# Patient Record
Sex: Female | Born: 1940 | Race: White | Hispanic: No | Marital: Married | Smoking: Never smoker
Health system: Southern US, Community
[De-identification: ages and names within clinical notes are randomized; demographics above are authoritative.]

## PROBLEM LIST (undated history)

## (undated) DIAGNOSIS — E7800 Pure hypercholesterolemia, unspecified: Secondary | ICD-10-CM

## (undated) DIAGNOSIS — M810 Age-related osteoporosis without current pathological fracture: Secondary | ICD-10-CM

## (undated) DIAGNOSIS — I10 Essential (primary) hypertension: Secondary | ICD-10-CM

## (undated) DIAGNOSIS — Z800 Family history of malignant neoplasm of digestive organs: Secondary | ICD-10-CM

## (undated) DIAGNOSIS — D219 Benign neoplasm of connective and other soft tissue, unspecified: Secondary | ICD-10-CM

## (undated) DIAGNOSIS — C801 Malignant (primary) neoplasm, unspecified: Secondary | ICD-10-CM

## (undated) DIAGNOSIS — C50919 Malignant neoplasm of unspecified site of unspecified female breast: Secondary | ICD-10-CM

## (undated) HISTORY — DX: Malignant neoplasm of unspecified site of unspecified female breast: C50.919

## (undated) HISTORY — DX: Family history of malignant neoplasm of digestive organs: Z80.0

## (undated) HISTORY — PX: BASAL CELL CARCINOMA EXCISION: SHX1214

## (undated) HISTORY — DX: Benign neoplasm of connective and other soft tissue, unspecified: D21.9

## (undated) HISTORY — DX: Pure hypercholesterolemia, unspecified: E78.00

## (undated) HISTORY — DX: Essential (primary) hypertension: I10

## (undated) HISTORY — DX: Age-related osteoporosis without current pathological fracture: M81.0

## (undated) HISTORY — DX: Malignant (primary) neoplasm, unspecified: C80.1

---

## 1973-09-10 HISTORY — PX: THYROID SURGERY: SHX805

## 1973-09-10 HISTORY — PX: BREAST SURGERY: SHX581

## 1998-02-14 ENCOUNTER — Ambulatory Visit (HOSPITAL_BASED_OUTPATIENT_CLINIC_OR_DEPARTMENT_OTHER): Admission: RE | Admit: 1998-02-14 | Discharge: 1998-02-14 | Payer: Self-pay

## 1998-02-17 ENCOUNTER — Other Ambulatory Visit: Admission: RE | Admit: 1998-02-17 | Discharge: 1998-02-17 | Payer: Self-pay | Admitting: Obstetrics and Gynecology

## 1999-02-22 ENCOUNTER — Other Ambulatory Visit: Admission: RE | Admit: 1999-02-22 | Discharge: 1999-02-22 | Payer: Self-pay | Admitting: Obstetrics and Gynecology

## 2000-02-14 ENCOUNTER — Other Ambulatory Visit: Admission: RE | Admit: 2000-02-14 | Discharge: 2000-02-14 | Payer: Self-pay | Admitting: Obstetrics and Gynecology

## 2001-02-13 ENCOUNTER — Other Ambulatory Visit: Admission: RE | Admit: 2001-02-13 | Discharge: 2001-02-13 | Payer: Self-pay | Admitting: Obstetrics and Gynecology

## 2002-04-15 ENCOUNTER — Other Ambulatory Visit: Admission: RE | Admit: 2002-04-15 | Discharge: 2002-04-15 | Payer: Self-pay | Admitting: Obstetrics and Gynecology

## 2003-04-02 ENCOUNTER — Ambulatory Visit (HOSPITAL_COMMUNITY): Admission: RE | Admit: 2003-04-02 | Discharge: 2003-04-02 | Payer: Self-pay | Admitting: Gastroenterology

## 2003-04-29 ENCOUNTER — Other Ambulatory Visit: Admission: RE | Admit: 2003-04-29 | Discharge: 2003-04-29 | Payer: Self-pay | Admitting: Obstetrics and Gynecology

## 2004-05-18 ENCOUNTER — Other Ambulatory Visit: Admission: RE | Admit: 2004-05-18 | Discharge: 2004-05-18 | Payer: Self-pay | Admitting: Obstetrics and Gynecology

## 2005-06-28 ENCOUNTER — Other Ambulatory Visit: Admission: RE | Admit: 2005-06-28 | Discharge: 2005-06-28 | Payer: Self-pay | Admitting: Obstetrics and Gynecology

## 2006-08-08 ENCOUNTER — Other Ambulatory Visit: Admission: RE | Admit: 2006-08-08 | Discharge: 2006-08-08 | Payer: Self-pay | Admitting: Obstetrics and Gynecology

## 2008-08-26 ENCOUNTER — Ambulatory Visit: Payer: Self-pay | Admitting: Obstetrics and Gynecology

## 2008-08-26 ENCOUNTER — Encounter: Payer: Self-pay | Admitting: Obstetrics and Gynecology

## 2008-08-26 ENCOUNTER — Other Ambulatory Visit: Admission: RE | Admit: 2008-08-26 | Discharge: 2008-08-26 | Payer: Self-pay | Admitting: Obstetrics and Gynecology

## 2008-09-21 ENCOUNTER — Ambulatory Visit: Payer: Self-pay | Admitting: Obstetrics and Gynecology

## 2009-07-25 ENCOUNTER — Ambulatory Visit: Payer: Self-pay | Admitting: Obstetrics and Gynecology

## 2009-10-04 ENCOUNTER — Ambulatory Visit: Payer: Self-pay | Admitting: Obstetrics and Gynecology

## 2009-11-16 ENCOUNTER — Ambulatory Visit: Payer: Self-pay | Admitting: Obstetrics and Gynecology

## 2009-11-22 ENCOUNTER — Ambulatory Visit: Payer: Self-pay | Admitting: Obstetrics and Gynecology

## 2009-11-28 ENCOUNTER — Ambulatory Visit: Payer: Self-pay | Admitting: Gynecology

## 2009-12-06 ENCOUNTER — Ambulatory Visit: Payer: Self-pay | Admitting: Obstetrics and Gynecology

## 2009-12-20 ENCOUNTER — Ambulatory Visit: Payer: Self-pay | Admitting: Obstetrics and Gynecology

## 2009-12-31 ENCOUNTER — Inpatient Hospital Stay (HOSPITAL_COMMUNITY)
Admission: AD | Admit: 2009-12-31 | Discharge: 2009-12-31 | Payer: Self-pay | Source: Home / Self Care | Admitting: Obstetrics and Gynecology

## 2010-01-11 ENCOUNTER — Ambulatory Visit: Payer: Self-pay | Admitting: Gynecology

## 2010-01-13 ENCOUNTER — Ambulatory Visit: Payer: Self-pay | Admitting: Obstetrics and Gynecology

## 2010-11-15 ENCOUNTER — Encounter (INDEPENDENT_AMBULATORY_CARE_PROVIDER_SITE_OTHER): Payer: Medicare Other | Admitting: Obstetrics and Gynecology

## 2010-11-15 ENCOUNTER — Other Ambulatory Visit: Payer: Self-pay | Admitting: Obstetrics and Gynecology

## 2010-11-15 ENCOUNTER — Other Ambulatory Visit (HOSPITAL_COMMUNITY)
Admission: RE | Admit: 2010-11-15 | Discharge: 2010-11-15 | Disposition: A | Discharge status: Home / Self Care | Payer: Medicare Other | Source: Ambulatory Visit | Attending: Obstetrics and Gynecology | Admitting: Obstetrics and Gynecology

## 2010-11-15 DIAGNOSIS — Z124 Encounter for screening for malignant neoplasm of cervix: Secondary | ICD-10-CM | POA: Insufficient documentation

## 2010-11-15 DIAGNOSIS — N83209 Unspecified ovarian cyst, unspecified side: Secondary | ICD-10-CM

## 2010-11-15 DIAGNOSIS — Z01419 Encounter for gynecological examination (general) (routine) without abnormal findings: Secondary | ICD-10-CM

## 2010-11-15 DIAGNOSIS — D259 Leiomyoma of uterus, unspecified: Secondary | ICD-10-CM

## 2010-11-15 DIAGNOSIS — R1900 Intra-abdominal and pelvic swelling, mass and lump, unspecified site: Secondary | ICD-10-CM

## 2010-11-15 DIAGNOSIS — N852 Hypertrophy of uterus: Secondary | ICD-10-CM

## 2010-11-15 DIAGNOSIS — R823 Hemoglobinuria: Secondary | ICD-10-CM

## 2010-11-16 ENCOUNTER — Ambulatory Visit
Admission: RE | Admit: 2010-11-16 | Discharge: 2010-11-16 | Disposition: A | Discharge status: Home / Self Care | Payer: Medicare Other | Source: Ambulatory Visit | Attending: Obstetrics and Gynecology | Admitting: Obstetrics and Gynecology

## 2010-11-16 DIAGNOSIS — R1900 Intra-abdominal and pelvic swelling, mass and lump, unspecified site: Secondary | ICD-10-CM

## 2010-11-16 MED ORDER — GADOBENATE DIMEGLUMINE 529 MG/ML IV SOLN
10.0000 mL | Freq: Once | INTRAVENOUS | Status: AC | PRN
  Administered 2010-11-16: 10 mL via INTRAVENOUS

## 2010-11-28 LAB — WET PREP, GENITAL: Clue Cells Wet Prep HPF POC: NONE SEEN

## 2011-04-30 ENCOUNTER — Encounter: Payer: Self-pay | Admitting: Gynecology

## 2011-05-09 ENCOUNTER — Inpatient Hospital Stay: Admission: RE | Admit: 2011-05-09 | Payer: Self-pay | Source: Ambulatory Visit

## 2011-05-09 ENCOUNTER — Other Ambulatory Visit: Payer: Medicare Other

## 2011-05-09 ENCOUNTER — Ambulatory Visit (INDEPENDENT_AMBULATORY_CARE_PROVIDER_SITE_OTHER): Payer: Medicare Other | Admitting: Obstetrics and Gynecology

## 2011-05-09 ENCOUNTER — Other Ambulatory Visit: Payer: Self-pay

## 2011-05-09 DIAGNOSIS — D3910 Neoplasm of uncertain behavior of unspecified ovary: Secondary | ICD-10-CM

## 2011-05-09 DIAGNOSIS — N8300 Follicular cyst of ovary, unspecified side: Secondary | ICD-10-CM

## 2011-05-09 DIAGNOSIS — N852 Hypertrophy of uterus: Secondary | ICD-10-CM

## 2011-05-09 DIAGNOSIS — D259 Leiomyoma of uterus, unspecified: Secondary | ICD-10-CM

## 2011-05-09 DIAGNOSIS — D251 Intramural leiomyoma of uterus: Secondary | ICD-10-CM

## 2011-05-09 DIAGNOSIS — D252 Subserosal leiomyoma of uterus: Secondary | ICD-10-CM

## 2011-05-09 NOTE — Progress Notes (Signed)
The patient came back to see me today for followup ultrasound because of the slightly atypical appearing fibroid. She had an ultrasound back in March which raised some concern about the appearance of the fibroid. 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 On ultrasound today the patient has multiple myomas. The one that was of concern to [place] is unchanged in size. Please see ultrasound report for dimensions. Her endometrial echo is up secured by the myomas. Her right ovary could not be imaged today. He was normal back in March on ultrasound. The small echo free cyst seen on her left ovary in March is now gone. The ovary appears normal. Immediately adjacent to the ovary next to the uterus and the ovary is what is probably a small myoma. Its dimensions are less than 2 cm. It does have positive PFD. Her cul-de-sac is free of fluid.  Assessment: Lipoleiomyoma unchanged .  Plan: Patient reassured. Since this solid lesion seen between the ovary and uterus appears new, we will re\re ultrasound her in 6 months. She also asked me today about sleep disturbance. She uses melatonin some nights and Ambien some nights. I reassured her about the Ambien. I told her I had no data on safety of melatonin. I offered to refer her to a sleep specialist and she will let me know.

## 2011-05-28 ENCOUNTER — Encounter: Payer: Self-pay | Admitting: Obstetrics and Gynecology

## 2011-09-15 DIAGNOSIS — M549 Dorsalgia, unspecified: Secondary | ICD-10-CM | POA: Diagnosis not present

## 2011-09-15 DIAGNOSIS — R509 Fever, unspecified: Secondary | ICD-10-CM | POA: Diagnosis not present

## 2011-10-01 DIAGNOSIS — N390 Urinary tract infection, site not specified: Secondary | ICD-10-CM | POA: Diagnosis not present

## 2011-11-07 ENCOUNTER — Other Ambulatory Visit: Payer: Medicare Other

## 2011-11-07 ENCOUNTER — Encounter: Payer: Self-pay | Admitting: *Deleted

## 2011-11-07 ENCOUNTER — Ambulatory Visit (INDEPENDENT_AMBULATORY_CARE_PROVIDER_SITE_OTHER): Payer: Medicare Other | Admitting: Obstetrics and Gynecology

## 2011-11-07 ENCOUNTER — Ambulatory Visit: Payer: Medicare Other | Admitting: Obstetrics and Gynecology

## 2011-11-07 ENCOUNTER — Other Ambulatory Visit: Payer: Self-pay | Admitting: *Deleted

## 2011-11-07 ENCOUNTER — Ambulatory Visit (INDEPENDENT_AMBULATORY_CARE_PROVIDER_SITE_OTHER): Payer: Medicare Other

## 2011-11-07 DIAGNOSIS — D259 Leiomyoma of uterus, unspecified: Secondary | ICD-10-CM | POA: Diagnosis not present

## 2011-11-07 DIAGNOSIS — N852 Hypertrophy of uterus: Secondary | ICD-10-CM | POA: Diagnosis not present

## 2011-11-07 DIAGNOSIS — D251 Intramural leiomyoma of uterus: Secondary | ICD-10-CM | POA: Diagnosis not present

## 2011-11-07 DIAGNOSIS — D219 Benign neoplasm of connective and other soft tissue, unspecified: Secondary | ICD-10-CM

## 2011-11-07 DIAGNOSIS — N83339 Acquired atrophy of ovary and fallopian tube, unspecified side: Secondary | ICD-10-CM

## 2011-11-07 DIAGNOSIS — N8300 Follicular cyst of ovary, unspecified side: Secondary | ICD-10-CM

## 2011-11-07 MED ORDER — METHYLPREDNISOLONE (PAK) 4 MG PO TABS: ORAL_TABLET | ORAL | Status: AC

## 2011-11-07 NOTE — Progress Notes (Signed)
Patient came back today for follow up ultrasound because of a  slightly atypical fibroid. When it was first discovered we sent her for MRI because of the appearance. 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 On ultrasound today it is stable in size at 7.2 cm. There still is a layer of fat within the fibroid. There continues to be arterial blood flow to the fibroid. Her endometrial lining was not identified due to the above. She is having no bleeding. The smaller fibroid seen on MRI are not visible. Her right ovary cannot be identified. No right adnexal masses seen. Her left ovary is normal. Her cul-de-sac is free of fluid. The patient also complained today of a hacking cough. Her PCP in the past has given her a steroid dose pack when this happens. She last took it 2 years ago. She is not running any fever.  Assessment: Stable fibroid. Upper respiratory infection.  Plan: Patient reassured. Medrol Dosepak given. Patient will see PCP if  problem persists.

## 2011-11-20 ENCOUNTER — Encounter: Payer: Self-pay | Admitting: Obstetrics and Gynecology

## 2011-11-20 ENCOUNTER — Ambulatory Visit (INDEPENDENT_AMBULATORY_CARE_PROVIDER_SITE_OTHER): Payer: Medicare Other | Admitting: Obstetrics and Gynecology

## 2011-11-20 VITALS — BP 116/70 | Ht 65.0 in | Wt 123.0 lb

## 2011-11-20 DIAGNOSIS — N952 Postmenopausal atrophic vaginitis: Secondary | ICD-10-CM

## 2011-11-20 DIAGNOSIS — E7800 Pure hypercholesterolemia, unspecified: Secondary | ICD-10-CM | POA: Insufficient documentation

## 2011-11-20 DIAGNOSIS — M8580 Other specified disorders of bone density and structure, unspecified site: Secondary | ICD-10-CM

## 2011-11-20 DIAGNOSIS — R3129 Other microscopic hematuria: Secondary | ICD-10-CM

## 2011-11-20 DIAGNOSIS — N9089 Other specified noninflammatory disorders of vulva and perineum: Secondary | ICD-10-CM

## 2011-11-20 DIAGNOSIS — D259 Leiomyoma of uterus, unspecified: Secondary | ICD-10-CM | POA: Diagnosis not present

## 2011-11-20 DIAGNOSIS — M949 Disorder of cartilage, unspecified: Secondary | ICD-10-CM | POA: Diagnosis not present

## 2011-11-20 DIAGNOSIS — D219 Benign neoplasm of connective and other soft tissue, unspecified: Secondary | ICD-10-CM

## 2011-11-20 NOTE — Progress Notes (Signed)
Patient came back to see me today for further followup. She is doing well with her fibroids without pain and bleeding. She just had an ultrasound here show stability of the fibroids. She's noticed something on her right labia near her buttock recently while bathing. It is not giving her any problems. She is having no urinary symptoms. She has low bone mass and is due for followup bone density. She takes calcium and vitamin D. She's had no fractures. She does her lab through PCP. She had a severe UTI in the fall. Followup urinalysis showed the infection gone but microscopic hematuria persisted. She has an appointment to see the urologist tomorrow. She continues with Premarin vaginal cream with good results for atrophic vaginitis.  ROS: 12 system review done. Pertinent positives above. Only other positive is hyperlipidemia.  Physical examination: Kennon Portela present. HEENT within normal limits. Neck: Thyroid not large. No masses. Supraclavicular nodes: not enlarged. Breasts: Examined in both sitting midline position. No skin changes and no masses. Abdomen: Soft no guarding rebound or masses or hernia. Pelvic: External: Within normal limits. Near buttock on right is a 1-1/2 cm firm lesion with a completely benign appearance.  BUS: Within normal limits. Vaginal:within normal limits. Good estrogen effect. No evidence of cystocele rectocele or enterocele. Cervix: clean. Uterus: 10-11 week fibroid uterus. Adnexa: No masses. Rectovaginal exam: Confirmatory and negative. Extremities: Within normal limits.  Assessment: #1. Stable fibroids #2. Atrophic vaginitis #3. Firm sebaceous cyst near right labia #4. Low bone mass  Plan: Continue yearly mammograms. Bone density scheduled. Continue Premarin vaginal cream. Observation of cyst on labia. Patient will return if any increase in size for excision.  Addendum: Patient asked me to look in her ears because of postnasal drip. Her right ear looked normal. Her left  tympanic membrane could not be visualized due to wax. She will buy something to remove the wax. If symptoms persist she will see her ent.

## 2011-11-21 DIAGNOSIS — R3129 Other microscopic hematuria: Secondary | ICD-10-CM | POA: Diagnosis not present

## 2011-11-22 DIAGNOSIS — R3129 Other microscopic hematuria: Secondary | ICD-10-CM | POA: Diagnosis not present

## 2012-01-08 DIAGNOSIS — R002 Palpitations: Secondary | ICD-10-CM | POA: Diagnosis not present

## 2012-01-09 ENCOUNTER — Ambulatory Visit (INDEPENDENT_AMBULATORY_CARE_PROVIDER_SITE_OTHER): Payer: Medicare Other

## 2012-01-09 DIAGNOSIS — M8580 Other specified disorders of bone density and structure, unspecified site: Secondary | ICD-10-CM

## 2012-01-09 DIAGNOSIS — M899 Disorder of bone, unspecified: Secondary | ICD-10-CM | POA: Diagnosis not present

## 2012-01-09 DIAGNOSIS — M949 Disorder of cartilage, unspecified: Secondary | ICD-10-CM | POA: Diagnosis not present

## 2012-01-11 DIAGNOSIS — R012 Other cardiac sounds: Secondary | ICD-10-CM | POA: Diagnosis not present

## 2012-01-11 DIAGNOSIS — R002 Palpitations: Secondary | ICD-10-CM | POA: Diagnosis not present

## 2012-01-15 DIAGNOSIS — R002 Palpitations: Secondary | ICD-10-CM | POA: Diagnosis not present

## 2012-01-17 DIAGNOSIS — R002 Palpitations: Secondary | ICD-10-CM | POA: Diagnosis not present

## 2012-01-28 DIAGNOSIS — H2510 Age-related nuclear cataract, unspecified eye: Secondary | ICD-10-CM | POA: Diagnosis not present

## 2012-03-26 DIAGNOSIS — G479 Sleep disorder, unspecified: Secondary | ICD-10-CM | POA: Diagnosis not present

## 2012-03-26 DIAGNOSIS — R002 Palpitations: Secondary | ICD-10-CM | POA: Diagnosis not present

## 2012-03-27 DIAGNOSIS — Z1231 Encounter for screening mammogram for malignant neoplasm of breast: Secondary | ICD-10-CM | POA: Diagnosis not present

## 2012-04-02 ENCOUNTER — Other Ambulatory Visit: Payer: Self-pay | Admitting: Obstetrics and Gynecology

## 2012-04-02 ENCOUNTER — Other Ambulatory Visit: Payer: Self-pay | Admitting: *Deleted

## 2012-04-02 DIAGNOSIS — R920 Mammographic microcalcification found on diagnostic imaging of breast: Secondary | ICD-10-CM | POA: Diagnosis not present

## 2012-04-02 DIAGNOSIS — N6459 Other signs and symptoms in breast: Secondary | ICD-10-CM

## 2012-04-03 ENCOUNTER — Encounter: Payer: Self-pay | Admitting: Obstetrics and Gynecology

## 2012-04-05 ENCOUNTER — Other Ambulatory Visit: Payer: Self-pay | Admitting: Obstetrics and Gynecology

## 2012-04-10 ENCOUNTER — Other Ambulatory Visit: Payer: Self-pay | Admitting: *Deleted

## 2012-04-10 ENCOUNTER — Other Ambulatory Visit: Payer: Self-pay | Admitting: Radiology

## 2012-04-10 DIAGNOSIS — R920 Mammographic microcalcification found on diagnostic imaging of breast: Secondary | ICD-10-CM

## 2012-04-10 DIAGNOSIS — N6019 Diffuse cystic mastopathy of unspecified breast: Secondary | ICD-10-CM | POA: Diagnosis not present

## 2012-04-10 DIAGNOSIS — N6089 Other benign mammary dysplasias of unspecified breast: Secondary | ICD-10-CM | POA: Diagnosis not present

## 2012-04-10 DIAGNOSIS — D249 Benign neoplasm of unspecified breast: Secondary | ICD-10-CM | POA: Diagnosis not present

## 2012-04-10 DIAGNOSIS — Z0189 Encounter for other specified special examinations: Secondary | ICD-10-CM | POA: Diagnosis not present

## 2012-04-11 ENCOUNTER — Other Ambulatory Visit: Payer: Self-pay | Admitting: Obstetrics and Gynecology

## 2012-04-11 DIAGNOSIS — R920 Mammographic microcalcification found on diagnostic imaging of breast: Secondary | ICD-10-CM

## 2012-05-21 DIAGNOSIS — Z23 Encounter for immunization: Secondary | ICD-10-CM | POA: Diagnosis not present

## 2012-06-05 DIAGNOSIS — I4949 Other premature depolarization: Secondary | ICD-10-CM | POA: Diagnosis not present

## 2012-06-05 DIAGNOSIS — R42 Dizziness and giddiness: Secondary | ICD-10-CM | POA: Diagnosis not present

## 2012-06-05 DIAGNOSIS — I498 Other specified cardiac arrhythmias: Secondary | ICD-10-CM | POA: Diagnosis not present

## 2012-06-05 DIAGNOSIS — R9431 Abnormal electrocardiogram [ECG] [EKG]: Secondary | ICD-10-CM | POA: Diagnosis not present

## 2012-06-05 DIAGNOSIS — R002 Palpitations: Secondary | ICD-10-CM | POA: Diagnosis not present

## 2012-06-09 DIAGNOSIS — I4949 Other premature depolarization: Secondary | ICD-10-CM | POA: Diagnosis not present

## 2012-06-11 DIAGNOSIS — H35039 Hypertensive retinopathy, unspecified eye: Secondary | ICD-10-CM | POA: Diagnosis not present

## 2012-06-11 DIAGNOSIS — H2510 Age-related nuclear cataract, unspecified eye: Secondary | ICD-10-CM | POA: Diagnosis not present

## 2012-06-18 DIAGNOSIS — R002 Palpitations: Secondary | ICD-10-CM | POA: Diagnosis not present

## 2012-06-18 DIAGNOSIS — I10 Essential (primary) hypertension: Secondary | ICD-10-CM | POA: Diagnosis not present

## 2012-07-16 DIAGNOSIS — R002 Palpitations: Secondary | ICD-10-CM | POA: Diagnosis not present

## 2012-07-16 DIAGNOSIS — Z0000 Encounter for general adult medical examination without abnormal findings: Secondary | ICD-10-CM | POA: Diagnosis not present

## 2012-07-16 DIAGNOSIS — I10 Essential (primary) hypertension: Secondary | ICD-10-CM | POA: Diagnosis not present

## 2012-07-16 DIAGNOSIS — I4949 Other premature depolarization: Secondary | ICD-10-CM | POA: Diagnosis not present

## 2012-07-16 DIAGNOSIS — M949 Disorder of cartilage, unspecified: Secondary | ICD-10-CM | POA: Diagnosis not present

## 2012-07-16 DIAGNOSIS — E7800 Pure hypercholesterolemia, unspecified: Secondary | ICD-10-CM | POA: Diagnosis not present

## 2012-07-16 DIAGNOSIS — Z1331 Encounter for screening for depression: Secondary | ICD-10-CM | POA: Diagnosis not present

## 2012-07-23 DIAGNOSIS — E782 Mixed hyperlipidemia: Secondary | ICD-10-CM | POA: Diagnosis not present

## 2012-07-23 DIAGNOSIS — N182 Chronic kidney disease, stage 2 (mild): Secondary | ICD-10-CM | POA: Diagnosis not present

## 2012-07-23 DIAGNOSIS — M949 Disorder of cartilage, unspecified: Secondary | ICD-10-CM | POA: Diagnosis not present

## 2012-07-23 DIAGNOSIS — I4949 Other premature depolarization: Secondary | ICD-10-CM | POA: Diagnosis not present

## 2012-07-23 DIAGNOSIS — R3129 Other microscopic hematuria: Secondary | ICD-10-CM | POA: Diagnosis not present

## 2012-07-23 DIAGNOSIS — I10 Essential (primary) hypertension: Secondary | ICD-10-CM | POA: Diagnosis not present

## 2012-07-23 DIAGNOSIS — M899 Disorder of bone, unspecified: Secondary | ICD-10-CM | POA: Diagnosis not present

## 2012-11-26 ENCOUNTER — Encounter: Payer: Self-pay | Admitting: Gynecology

## 2012-11-26 ENCOUNTER — Ambulatory Visit (INDEPENDENT_AMBULATORY_CARE_PROVIDER_SITE_OTHER): Payer: Medicare Other | Admitting: Gynecology

## 2012-11-26 VITALS — BP 120/76 | Ht 65.0 in | Wt 124.0 lb

## 2012-11-26 DIAGNOSIS — M8580 Other specified disorders of bone density and structure, unspecified site: Secondary | ICD-10-CM

## 2012-11-26 DIAGNOSIS — N907 Vulvar cyst: Secondary | ICD-10-CM

## 2012-11-26 DIAGNOSIS — N952 Postmenopausal atrophic vaginitis: Secondary | ICD-10-CM

## 2012-11-26 DIAGNOSIS — D259 Leiomyoma of uterus, unspecified: Secondary | ICD-10-CM | POA: Diagnosis not present

## 2012-11-26 DIAGNOSIS — N9089 Other specified noninflammatory disorders of vulva and perineum: Secondary | ICD-10-CM

## 2012-11-26 DIAGNOSIS — M899 Disorder of bone, unspecified: Secondary | ICD-10-CM | POA: Diagnosis not present

## 2012-11-26 DIAGNOSIS — M949 Disorder of cartilage, unspecified: Secondary | ICD-10-CM

## 2012-11-26 MED ORDER — ESTRADIOL 0.1 MG/GM VA CREA: 2.0000 g | TOPICAL_CREAM | Freq: Every day | VAGINAL | Status: DC

## 2012-11-26 NOTE — Patient Instructions (Signed)
Follow up in one year for vulvar biopsy

## 2012-11-26 NOTE — Progress Notes (Signed)
Vanessa Shaffer Apr 11, 1941 [id number]        72 y.o.  G2P2002 for followup exam.  Several issues noted below.  Past medical history,surgical history, medications, allergies, family history and social history were all reviewed and documented in the EPIC chart. ROS:  Was performed and pertinent positives and negatives are included in the history.  Exam: Kim assistant Filed Vitals:   11/26/12 1003  BP: 120/76  Height: 5\' 5"  (1.651 m)  Weight: 124 lb (56.246 kg)   General appearance  Normal Skin grossly normal Head/Neck normal with no cervical or supraclavicular adenopathy thyroid normal Lungs  clear Cardiac RR, without RMG Abdominal  soft, nontender, without masses, organomegaly or hernia Breasts  examined lying and sitting without masses, retractions, discharge or axillary adenopathy. Pelvic  Ext/BUS/vagina  normal with atrophic changes.   Cervix  normal with atrophic changes  Uterus  10 weeks irregular consistent with leiomyoma midline mobile nontender.  Adnexa  Without masses or tenderness    Anus and perineum  1.5 cm classic sebaceous cyst right perineal body lateral to the anus.   Rectovaginal  normal sphincter tone without palpated masses or tenderness.    Assessment/Plan:  72 y.o. [id number] female for followup exam.   1. Sebaceous cyst classic in appearance perineal body. Stable over time. Patient does note though that it bothers her to have it here.  Options of observation, excision, incision with drainage reviewed. The pros and cons of each option discussed to include if incision the risk of recurrence. Patient would like to have it incised recognizing that it might recur and she'll make an appointment to do so. 2. Leiomyoma. Ultrasound last year showed 7 cm leiomyoma. Her exam is stable over time. She is asymptomatic and we will continue to observe with reexamination in one year. No bleeding or other symptoms. 3. Atrophic vaginitis. Using Premarin cream twice weekly with  good results. Options to include HRT, vaginal estrogen cream, Vagifem, Osphena were reviewed. The pros/cons, risks/benefits discussed. The issues of absorption with stroke heart attack DVT possible breast cancer reviewed. Patient was to continue on vaginal cream. I did write for Estrace vaginal cream as her Premarin cream is for expensive we'll see if this much cheaper. We'll continue to use it twice weekly. 4. Osteopenia. DEXA 01/2012 with T score -2.1. FRAX 11%/2.4%. She did have a statistically significant decline at all sites measured from her prior study 2010. It was elected by Dr. Eda Paschal to follow with increased calcium vitamin D as it was not an increased risk of fracture on FRAX. Plan repeat DEXA next year a 2 year interval. Continue with extra calcium vitamin D. 5. Pap smear 2012. No Pap smear done today. No history of abnormal Pap smears. Options to stop screening altogether she is over the age of 62 versus less frequent intervals reviewed and we'll readdress on an annual basis. 6. Mammography 04/2012. Continue with annual mammography. SBE monthly reviewed. 7. Colonoscopy 2009. Repeat recommended interval. 8. Health maintenance. No lab work done as this is all done through her primary physician's office who she sees on a regular basis. Followup for vulvar incision of her sebaceous cyst otherwise annually   Dara Lords MD, 10:35 AM 11/26/2012

## 2012-12-03 DIAGNOSIS — R3129 Other microscopic hematuria: Secondary | ICD-10-CM | POA: Diagnosis not present

## 2012-12-10 ENCOUNTER — Encounter: Payer: Self-pay | Admitting: Gynecology

## 2012-12-10 ENCOUNTER — Ambulatory Visit (INDEPENDENT_AMBULATORY_CARE_PROVIDER_SITE_OTHER): Payer: Medicare Other | Admitting: Gynecology

## 2012-12-10 DIAGNOSIS — L723 Sebaceous cyst: Secondary | ICD-10-CM

## 2012-12-10 NOTE — Patient Instructions (Addendum)
Sitz baths in warm water several times daily. Call if you have any questions.

## 2012-12-10 NOTE — Progress Notes (Signed)
Patient presents to have her perineal sebaceous cyst drained. Long history of cyst it is becoming bothersome to her. Options for incision versus excision reviewed. Patient understands with incision risk of recurrence exists. With excision would require little more aggressive surgery and outpatient surgical setting. Patient elects for incision.  Exam with Selena Batten assistant External BUS vagina with classic 1-1.5 cm sebaceous cyst right perineal body lateral and superior to anal opening.  Procedure: Skin overlying cyst was cleansed with Betadine and infiltrated with 1% lidocaine. An incision into the cyst with the scalpel was made and classic sebaceous material was extruded. Forceps were used to evacuate the entire cyst contents and subsequently irrigated. Hemostasis at the cut skin edge was spontaneous with pressure .  Assessment and plan: Sebaceous cyst, incised and drained. Patient instructed for sitz baths several times daily. Will followup if there any issues such as redness increasing tenderness or any other questions. Otherwise will followup routinely.

## 2013-01-14 DIAGNOSIS — R002 Palpitations: Secondary | ICD-10-CM | POA: Diagnosis not present

## 2013-01-14 DIAGNOSIS — I10 Essential (primary) hypertension: Secondary | ICD-10-CM | POA: Diagnosis not present

## 2013-01-14 DIAGNOSIS — E7800 Pure hypercholesterolemia, unspecified: Secondary | ICD-10-CM | POA: Diagnosis not present

## 2013-01-14 DIAGNOSIS — I4949 Other premature depolarization: Secondary | ICD-10-CM | POA: Diagnosis not present

## 2013-01-28 DIAGNOSIS — E782 Mixed hyperlipidemia: Secondary | ICD-10-CM | POA: Diagnosis not present

## 2013-01-28 DIAGNOSIS — Z800 Family history of malignant neoplasm of digestive organs: Secondary | ICD-10-CM | POA: Diagnosis not present

## 2013-01-28 DIAGNOSIS — N182 Chronic kidney disease, stage 2 (mild): Secondary | ICD-10-CM | POA: Diagnosis not present

## 2013-01-28 DIAGNOSIS — I10 Essential (primary) hypertension: Secondary | ICD-10-CM | POA: Diagnosis not present

## 2013-02-16 DIAGNOSIS — H2510 Age-related nuclear cataract, unspecified eye: Secondary | ICD-10-CM | POA: Diagnosis not present

## 2013-04-15 DIAGNOSIS — Z09 Encounter for follow-up examination after completed treatment for conditions other than malignant neoplasm: Secondary | ICD-10-CM | POA: Diagnosis not present

## 2013-04-15 DIAGNOSIS — D249 Benign neoplasm of unspecified breast: Secondary | ICD-10-CM | POA: Diagnosis not present

## 2013-04-16 ENCOUNTER — Other Ambulatory Visit: Payer: Self-pay | Admitting: *Deleted

## 2013-04-16 ENCOUNTER — Encounter: Payer: Self-pay | Admitting: Gynecology

## 2013-04-16 DIAGNOSIS — R928 Other abnormal and inconclusive findings on diagnostic imaging of breast: Secondary | ICD-10-CM

## 2013-05-12 ENCOUNTER — Telehealth: Payer: Self-pay | Admitting: *Deleted

## 2013-05-12 NOTE — Telephone Encounter (Signed)
Pt called stating that she spoke with you about a Rx for a formula drug that could be sent to [facility] that was possibly cheaper than  Premarin? Pt would like this Rx if possible. Please advise

## 2013-05-12 NOTE — Telephone Encounter (Signed)
Left message that sample of premarin vaginal cream up front for pick and if she would like Rx to let me know and I will call in.

## 2013-05-12 NOTE — Telephone Encounter (Signed)
[facility] mix up the vaginal estrogen cream in individual syringes and you can check with them to prescribe. Use twice weekly refill x6 months. I am not sure of [facility] does the same.

## 2013-05-14 NOTE — Telephone Encounter (Signed)
Pt will go to [facility] to figure out price and call back if she would like Rx called in.

## 2013-05-17 DIAGNOSIS — Z23 Encounter for immunization: Secondary | ICD-10-CM | POA: Diagnosis not present

## 2013-05-21 MED ORDER — NONFORMULARY OR COMPOUNDED ITEM: Status: DC

## 2013-05-21 NOTE — Telephone Encounter (Signed)
rx called in to [facility], pt informed.

## 2013-05-21 NOTE — Addendum Note (Signed)
Addended by: Aura Camps on: 05/21/2013 04:42 PM   Modules accepted: Orders

## 2013-07-22 ENCOUNTER — Encounter (INDEPENDENT_AMBULATORY_CARE_PROVIDER_SITE_OTHER): Payer: Self-pay

## 2013-07-22 ENCOUNTER — Ambulatory Visit (INDEPENDENT_AMBULATORY_CARE_PROVIDER_SITE_OTHER): Payer: Medicare Other | Admitting: Cardiology

## 2013-07-22 ENCOUNTER — Encounter: Payer: Self-pay | Admitting: Cardiology

## 2013-07-22 VITALS — BP 128/70 | HR 66 | Ht 65.0 in | Wt 121.1 lb

## 2013-07-22 DIAGNOSIS — I498 Other specified cardiac arrhythmias: Secondary | ICD-10-CM | POA: Insufficient documentation

## 2013-07-22 DIAGNOSIS — E7800 Pure hypercholesterolemia, unspecified: Secondary | ICD-10-CM | POA: Diagnosis not present

## 2013-07-22 DIAGNOSIS — I10 Essential (primary) hypertension: Secondary | ICD-10-CM | POA: Diagnosis not present

## 2013-07-22 NOTE — Patient Instructions (Signed)
Your physician recommends that you continue on your current medications as directed. Please refer to the Current Medication list given to you today.  Your physician wants you to follow-up in: 1 year with Dr. Skains. You will receive a reminder letter in the mail two months in advance. If you don't receive a letter, please call our office to schedule the follow-up appointment.  

## 2013-07-22 NOTE — Progress Notes (Signed)
      [address], [address] [place], [place]  [address] Phone: [phone] Fax:  [phone]  Date:  07/22/2013   ID:  Vanessa Shaffer, DOB Oct 19, 1940, MRN [id number]  PCP:  Georgann Housekeeper, MD   History of Present Illness: Vanessa Shaffer is a 71 y.o. female with previously described ventricular bigeminy seen on Holter monitor with no other adverse arrhythmias, hypertension, hyperlipidemia here for followup.  She underwent exercise treadmill test 6 minutes 30 seconds with occasional PVCs, no ischemic changes. Echocardiogram with normal ejection fraction, mild aortic regurgitation otherwise normal.  Doing very well. Playing golf. Scored 99. Walking the course. No palpitations. Her main complaint is insomnia. Takes melatonin, Benadryl. I suggested taking metoprolol in evening.   Wt Readings from Last 3 Encounters:  07/22/13 121 lb 1.9 oz (54.94 kg)  11/26/12 124 lb (56.246 kg)  11/20/11 123 lb (55.792 kg)     Past Medical History  Diagnosis Date  . Elevated cholesterol   . Hypertension   . Osteopenia 01/2012    T score -2.1 FRAX 11%/2.4%  . Leiomyoma     Past Surgical History  Procedure Laterality Date  . Breast surgery  1975    Cyst  . Thyroid surgery  1975    Cyst    Current Outpatient Prescriptions  Medication Sig Dispense Refill  . atorvastatin (LIPITOR) 10 MG tablet Take 10 mg by mouth daily.        . Calcium Carbonate-Vitamin D (CALCIUM + D PO) Take by mouth daily.        . metoprolol succinate (TOPROL-XL) 50 MG 24 hr tablet Take 50 mg by mouth daily. Take with or immediately following a meal.      . NONFORMULARY OR COMPOUNDED ITEM vaginal estrogen cream twice weekly refill x6 months  1 each  6   No current facility-administered medications for this visit.    Allergies:    Allergies  Allergen Reactions  . Codeine   . Terazol [Terconazole]     Social History:  The patient  reports that she has never smoked. She does not have any smokeless  tobacco history on file. She reports that she drinks alcohol.   ROS:  Please see the history of present illness.   Denies any fevers, chills, orthopnea, PND  PHYSICAL EXAM: VS:  BP 128/70  Pulse 66  Ht 5\' 5"  (1.651 m)  Wt 121 lb 1.9 oz (54.94 kg)  BMI 20.16 kg/m2  SpO2 98% Well nourished, well developed, in no acute distress HEENT: normal Neck: no JVD Cardiac:  normal S1, S2; RRR; no murmur Lungs:  clear to auscultation bilaterally, no wheezing, rhonchi or rales Abd: soft, nontender, no hepatomegaly Ext: no edema Skin: warm and dry Neuro: no focal abnormalities noted  EKG:  Prior EKG demonstrated ventricular bigeminy.     ASSESSMENT AND PLAN:  1. Ventricular bigeminy/palpitations-overall reasonable control with metoprolol/beta blocker. We will continue. Doing very well. 2. Hypertension-overall reasonable control on current regimen. No changes made. 3. Aortic regurgitation-mild in severity. Monitor clinically. 4. Hyperlipidemia-continue with statin. Prior LDL 95 5. Insomnia-suggested trying metoprolol in the evening hours.  Signed, Donato Schultz, MD [facility]  07/22/2013 9:35 AM

## 2013-07-29 ENCOUNTER — Telehealth: Payer: Self-pay | Admitting: *Deleted

## 2013-07-29 MED ORDER — NONFORMULARY OR COMPOUNDED ITEM: Status: DC

## 2013-07-29 NOTE — Telephone Encounter (Signed)
Pt called requesting Rx to be called in [facility] for compound estradiol as noted on telephone encounter on 05/12/13. I left message for pt to call to let her know [facility] does not make this Rx in individual syringes [facility] and I could call rx in there.

## 2013-07-29 NOTE — Telephone Encounter (Signed)
Pt would like rx called into [facility] for the vaginal estrogen cream noted on 05/12/13. Pt is aware of price and okay with Rx. rx called in.

## 2013-08-12 DIAGNOSIS — Z1331 Encounter for screening for depression: Secondary | ICD-10-CM | POA: Diagnosis not present

## 2013-08-12 DIAGNOSIS — E782 Mixed hyperlipidemia: Secondary | ICD-10-CM | POA: Diagnosis not present

## 2013-08-12 DIAGNOSIS — Z0000 Encounter for general adult medical examination without abnormal findings: Secondary | ICD-10-CM | POA: Diagnosis not present

## 2013-08-12 DIAGNOSIS — Z23 Encounter for immunization: Secondary | ICD-10-CM | POA: Diagnosis not present

## 2013-08-12 DIAGNOSIS — N182 Chronic kidney disease, stage 2 (mild): Secondary | ICD-10-CM | POA: Diagnosis not present

## 2013-08-12 DIAGNOSIS — I10 Essential (primary) hypertension: Secondary | ICD-10-CM | POA: Diagnosis not present

## 2013-08-26 DIAGNOSIS — K5730 Diverticulosis of large intestine without perforation or abscess without bleeding: Secondary | ICD-10-CM | POA: Diagnosis not present

## 2013-08-26 DIAGNOSIS — Z8371 Family history of colonic polyps: Secondary | ICD-10-CM | POA: Diagnosis not present

## 2013-08-26 DIAGNOSIS — Z1211 Encounter for screening for malignant neoplasm of colon: Secondary | ICD-10-CM | POA: Diagnosis not present

## 2013-08-26 DIAGNOSIS — Z800 Family history of malignant neoplasm of digestive organs: Secondary | ICD-10-CM | POA: Diagnosis not present

## 2013-08-31 DIAGNOSIS — J069 Acute upper respiratory infection, unspecified: Secondary | ICD-10-CM | POA: Diagnosis not present

## 2013-09-16 DIAGNOSIS — H35039 Hypertensive retinopathy, unspecified eye: Secondary | ICD-10-CM | POA: Diagnosis not present

## 2013-09-16 DIAGNOSIS — H2510 Age-related nuclear cataract, unspecified eye: Secondary | ICD-10-CM | POA: Diagnosis not present

## 2013-09-16 DIAGNOSIS — H52229 Regular astigmatism, unspecified eye: Secondary | ICD-10-CM | POA: Diagnosis not present

## 2013-09-16 DIAGNOSIS — H5210 Myopia, unspecified eye: Secondary | ICD-10-CM | POA: Diagnosis not present

## 2013-10-15 ENCOUNTER — Other Ambulatory Visit: Payer: Self-pay | Admitting: Cardiology

## 2013-12-02 ENCOUNTER — Other Ambulatory Visit (HOSPITAL_COMMUNITY)
Admission: RE | Admit: 2013-12-02 | Discharge: 2013-12-02 | Disposition: A | Discharge status: Home / Self Care | Payer: Medicare Other | Source: Ambulatory Visit | Attending: Gynecology | Admitting: Gynecology

## 2013-12-02 ENCOUNTER — Encounter: Payer: Self-pay | Admitting: Gynecology

## 2013-12-02 ENCOUNTER — Telehealth: Payer: Self-pay | Admitting: *Deleted

## 2013-12-02 ENCOUNTER — Ambulatory Visit (INDEPENDENT_AMBULATORY_CARE_PROVIDER_SITE_OTHER): Payer: Medicare Other | Admitting: Gynecology

## 2013-12-02 VITALS — BP 120/74 | Ht 65.0 in | Wt 122.0 lb

## 2013-12-02 DIAGNOSIS — N952 Postmenopausal atrophic vaginitis: Secondary | ICD-10-CM

## 2013-12-02 DIAGNOSIS — M949 Disorder of cartilage, unspecified: Secondary | ICD-10-CM | POA: Diagnosis not present

## 2013-12-02 DIAGNOSIS — D251 Intramural leiomyoma of uterus: Secondary | ICD-10-CM | POA: Diagnosis not present

## 2013-12-02 DIAGNOSIS — M8580 Other specified disorders of bone density and structure, unspecified site: Secondary | ICD-10-CM

## 2013-12-02 DIAGNOSIS — Z124 Encounter for screening for malignant neoplasm of cervix: Secondary | ICD-10-CM

## 2013-12-02 DIAGNOSIS — M899 Disorder of bone, unspecified: Secondary | ICD-10-CM

## 2013-12-02 MED ORDER — NONFORMULARY OR COMPOUNDED ITEM: Status: DC

## 2013-12-02 NOTE — Patient Instructions (Signed)
Followup for bone density as scheduled. Followup in one year for annual exam, sooner as needed.

## 2013-12-02 NOTE — Addendum Note (Signed)
Addended by: Nelva Nay on: 12/02/2013 11:39 AM   Modules accepted: Orders

## 2013-12-02 NOTE — Progress Notes (Signed)
Vanessa Shaffer 06-11-1941 [id number]        72 y.o.  G2P2002 for followup exam.  Several issues noted below.  Past medical history,surgical history, problem list, medications, allergies, family history and social history were all reviewed and documented in the EPIC chart.  ROS:  Performed and pertinent positives and negatives are included in the history, assessment and plan .  Exam: Kim assistant Filed Vitals:   12/02/13 1037  BP: 120/74  Height: 5\' 5"  (1.651 m)  Weight: 122 lb (55.339 kg)   General appearance  Normal Skin grossly normal Head/Neck normal with no cervical or supraclavicular adenopathy thyroid normal Lungs  clear Cardiac RR, without RMG Abdominal  soft, nontender, without masses, organomegaly or hernia Breasts  examined lying and sitting without masses, retractions, discharge or axillary adenopathy. Pelvic  Ext/BUS/vagina with generalized atrophic changes.  Cervix atrophic. Pap  Uterus mildly enlarged, midline and mobile nontender   Adnexa  Without masses or tenderness    Anus and perineum  Normal   Rectovaginal  Normal sphincter tone without palpated masses or tenderness.    Assessment/Plan:  73 y.o. [id number] female for annual exam.   1. Postmenopausal/atrophic genital changes. Patient using formulated estradiol vaginal cream twice weekly. Doing well with this and wants to continue for vaginal dryness and dyspareunia. We've discussed the risks benefits as noted in the 11/26/2012 noted. Refill given for one year. No bleeding, significant hot flushes or night sweats. Patient knows to report any vaginal bleeding. 2. Leiomyoma. History of 7 cm leiomyoma on ultrasound. Exam with mildly enlarged uterus consistent with past exams. Patient is asymptomatic. We'll continue to monitor with annual exams. 3. Osteopenia. DEXA 01/2012 T score -2.1. FRAX 11%/2.4%. She did have a statistically significant decline at measured sites but elected to monitor per Dr. Valeta Harms  discussion. Repeat DEXA now at two-year interval. Increased calcium and vitamin D recommendations reviewed. Check vitamin D level today. 4. Pap smear 2012. Pap done today. Reviewed current screening guidelines and options to stop screening after the age of 44 and she has no history of significant abnormalities reviewed. Patient's uncomfortable with this recommendation and prefers screening. 5. Mammography 04/2013. Continue with annual mammography. SBE monthly reviewed. Colonoscopy 2014. Repeat at their recommended interval. 6. Health maintenance. No routine blood work done as this is all done through her primary physician's office. Followup one year, sooner as needed.   Note: This document was prepared with digital dictation and possible smart phrase technology. Any transcriptional errors that result from this process are unintentional.   [name] MD, 11:05 AM 12/02/2013

## 2013-12-02 NOTE — Telephone Encounter (Signed)
rx called in

## 2013-12-02 NOTE — Telephone Encounter (Signed)
Message copied by Thamas Jaegers on Wed Dec 02, 2013 12:26 PM ------      Message from: [name]      Created: Wed Dec 02, 2013 11:47 AM       I went to put in for her estradiol vaginal cream twice weekly prescription to [facility] and the number that pulled upwards #90 with one refill. That did not seem right. She needs a refill for her estradiol 2% vaginal cream twice weekly applications for a total of one year. ------

## 2013-12-03 LAB — VITAMIN D 25 HYDROXY (VIT D DEFICIENCY, FRACTURES): VIT D 25 HYDROXY: 41 ng/mL (ref 30–89)

## 2014-02-05 ENCOUNTER — Other Ambulatory Visit: Payer: Self-pay | Admitting: Dermatology

## 2014-02-05 DIAGNOSIS — L570 Actinic keratosis: Secondary | ICD-10-CM | POA: Diagnosis not present

## 2014-02-05 DIAGNOSIS — L738 Other specified follicular disorders: Secondary | ICD-10-CM | POA: Diagnosis not present

## 2014-02-05 DIAGNOSIS — C4370 Malignant melanoma of unspecified lower limb, including hip: Secondary | ICD-10-CM | POA: Diagnosis not present

## 2014-02-05 DIAGNOSIS — L819 Disorder of pigmentation, unspecified: Secondary | ICD-10-CM | POA: Diagnosis not present

## 2014-02-05 DIAGNOSIS — C44711 Basal cell carcinoma of skin of unspecified lower limb, including hip: Secondary | ICD-10-CM | POA: Diagnosis not present

## 2014-02-05 DIAGNOSIS — L820 Inflamed seborrheic keratosis: Secondary | ICD-10-CM | POA: Diagnosis not present

## 2014-02-05 DIAGNOSIS — D485 Neoplasm of uncertain behavior of skin: Secondary | ICD-10-CM | POA: Diagnosis not present

## 2014-02-05 DIAGNOSIS — D235 Other benign neoplasm of skin of trunk: Secondary | ICD-10-CM | POA: Diagnosis not present

## 2014-02-11 DIAGNOSIS — C44711 Basal cell carcinoma of skin of unspecified lower limb, including hip: Secondary | ICD-10-CM | POA: Diagnosis not present

## 2014-02-17 ENCOUNTER — Telehealth: Payer: Self-pay

## 2014-02-17 NOTE — Telephone Encounter (Signed)
Patient called to check her VIT D level result from 12/02/13 as she never heard on it.  I did let her know it was normal.  She questioned should she continue taking her otc calcium and vit d like she has been.

## 2014-02-17 NOTE — Telephone Encounter (Signed)
Patient advised.

## 2014-02-17 NOTE — Telephone Encounter (Signed)
Yes continue on her calcium and vitamin D as she has been taking.

## 2014-02-19 DIAGNOSIS — L080 Pyoderma: Secondary | ICD-10-CM | POA: Diagnosis not present

## 2014-02-22 ENCOUNTER — Ambulatory Visit (INDEPENDENT_AMBULATORY_CARE_PROVIDER_SITE_OTHER): Payer: Medicare Other

## 2014-02-22 DIAGNOSIS — M899 Disorder of bone, unspecified: Secondary | ICD-10-CM

## 2014-02-22 DIAGNOSIS — M949 Disorder of cartilage, unspecified: Secondary | ICD-10-CM | POA: Diagnosis not present

## 2014-02-22 DIAGNOSIS — M8580 Other specified disorders of bone density and structure, unspecified site: Secondary | ICD-10-CM

## 2014-02-24 ENCOUNTER — Other Ambulatory Visit: Payer: Self-pay | Admitting: *Deleted

## 2014-02-24 DIAGNOSIS — M8580 Other specified disorders of bone density and structure, unspecified site: Secondary | ICD-10-CM

## 2014-02-24 DIAGNOSIS — M898X9 Other specified disorders of bone, unspecified site: Secondary | ICD-10-CM

## 2014-02-25 DIAGNOSIS — Z85828 Personal history of other malignant neoplasm of skin: Secondary | ICD-10-CM | POA: Diagnosis not present

## 2014-03-03 DIAGNOSIS — L905 Scar conditions and fibrosis of skin: Secondary | ICD-10-CM | POA: Diagnosis not present

## 2014-03-03 DIAGNOSIS — D485 Neoplasm of uncertain behavior of skin: Secondary | ICD-10-CM | POA: Diagnosis not present

## 2014-03-03 DIAGNOSIS — C4370 Malignant melanoma of unspecified lower limb, including hip: Secondary | ICD-10-CM | POA: Diagnosis not present

## 2014-03-04 DIAGNOSIS — Z85828 Personal history of other malignant neoplasm of skin: Secondary | ICD-10-CM | POA: Diagnosis not present

## 2014-03-04 DIAGNOSIS — L270 Generalized skin eruption due to drugs and medicaments taken internally: Secondary | ICD-10-CM | POA: Diagnosis not present

## 2014-03-05 DIAGNOSIS — IMO0001 Reserved for inherently not codable concepts without codable children: Secondary | ICD-10-CM | POA: Diagnosis not present

## 2014-03-06 ENCOUNTER — Emergency Department (HOSPITAL_COMMUNITY)
Admission: EM | Admit: 2014-03-06 | Discharge: 2014-03-06 | Disposition: A | Discharge status: Home / Self Care | Payer: Medicare Other | Attending: Emergency Medicine | Admitting: Emergency Medicine

## 2014-03-06 ENCOUNTER — Encounter (HOSPITAL_COMMUNITY): Payer: Self-pay | Admitting: Emergency Medicine

## 2014-03-06 ENCOUNTER — Emergency Department (HOSPITAL_COMMUNITY): Payer: Medicare Other

## 2014-03-06 DIAGNOSIS — M79602 Pain in left arm: Secondary | ICD-10-CM

## 2014-03-06 DIAGNOSIS — Z79899 Other long term (current) drug therapy: Secondary | ICD-10-CM | POA: Diagnosis not present

## 2014-03-06 DIAGNOSIS — E7800 Pure hypercholesterolemia, unspecified: Secondary | ICD-10-CM | POA: Insufficient documentation

## 2014-03-06 DIAGNOSIS — R209 Unspecified disturbances of skin sensation: Secondary | ICD-10-CM | POA: Diagnosis not present

## 2014-03-06 DIAGNOSIS — M810 Age-related osteoporosis without current pathological fracture: Secondary | ICD-10-CM | POA: Diagnosis not present

## 2014-03-06 DIAGNOSIS — I10 Essential (primary) hypertension: Secondary | ICD-10-CM | POA: Diagnosis not present

## 2014-03-06 DIAGNOSIS — M79609 Pain in unspecified limb: Secondary | ICD-10-CM | POA: Insufficient documentation

## 2014-03-06 NOTE — ED Notes (Signed)
Pt c/o left shoulder pain that radiates to left hand that began last night. She reports that her pain is better than yesterday. She has some left hand swelling. Good strong radial pulses bilaterally. She was recently placed on a Sulfa abx for removal of skin cancer to the RLL 10 days ago and has been off for a few days. She had a allergic reaction to the medication with symptoms of joints aches and rash. She is on prednisone for the allergic reaction.

## 2014-03-06 NOTE — ED Notes (Signed)
Pt refusing to stay for further testing. Discharge papers given and patient left with husband. Pt told to follow up with PCP.

## 2014-03-06 NOTE — Discharge Instructions (Signed)

## 2014-03-06 NOTE — ED Provider Notes (Signed)
CSN: [id number]     Arrival date & time 03/06/14  1118 History   First MD Initiated Contact with Patient 03/06/14 1242     Chief Complaint  Patient presents with  . Numbness    HPI Pt had an episode of having pain in her right arm and hand.  It started last night.  The arm and hand were painful.  She also noticed a little bit of tingling.  She was not having any trouble with chest pain or shortness of breath.  She did not notice anything that made it better or worse.   It was worse last night and seems better today.  Pt does not have any history of heart or lung problems.  She denies shortness of breath, fever or coughing.  She has had a negative stress test in the past.  She exercises regularly without difficulty.   she called her doctor who told her it sounded like she had a pinched nerve. However, he recommended she come to the emergency department to have an EKG.   Past Medical History  Diagnosis Date  . Elevated cholesterol   . Hypertension   . Osteopenia 01/2012    T score -2.1 FRAX 11%/2.4%  . Leiomyoma    Past Surgical History  Procedure Laterality Date  . Breast surgery  1975    Cyst  . Thyroid surgery  1975    Cyst   Family History  Problem Relation Age of Onset  . Hypertension Mother   . Heart disease Father    History  Substance Use Topics  . Smoking status: Never Smoker   . Smokeless tobacco: Not on file  . Alcohol Use: Yes     Comment: rare   OB History   Grav Para Term Preterm Abortions TAB SAB Ect Mult Living   2 2 2       2      Review of Systems  All other systems reviewed and are negative.     Allergies  Sulfa antibiotics; Terazol; Ambien; and Codeine  Home Medications   Prior to Admission medications   Medication Sig Start Date End Date Taking? Authorizing Provider  acetaminophen (TYLENOL) 500 MG tablet Take 1,000 mg by mouth every 6 (six) hours as needed for moderate pain.   Yes Historical Provider, MD  atorvastatin (LIPITOR) 10 MG tablet  Take 10 mg by mouth daily.   Yes Historical Provider, MD  Calcium Carbonate-Vitamin D (CALCIUM + D PO) Take by mouth daily.     Yes Historical Provider, MD  ibuprofen (ADVIL,MOTRIN) 200 MG tablet Take 400-600 mg by mouth every 6 (six) hours as needed for moderate pain.   Yes Historical Provider, MD  metoprolol succinate (TOPROL-XL) 50 MG 24 hr tablet Take 50 mg by mouth daily. Take with or immediately following a meal.   Yes Historical Provider, MD  NONFORMULARY OR COMPOUNDED ITEM Vaginal estrogen 0.02% cream twice weekly 12/02/13  Yes Timothy P Fontaine, MD   BP 124/61  Pulse 65  Temp(Src) 97.4 F (36.3 C) (Oral)  Resp 17  SpO2 96% Physical Exam  Nursing note and vitals reviewed. Constitutional: She is oriented to person, place, and time. She appears well-developed and well-nourished. No distress.  HENT:  Head: Normocephalic and atraumatic.  Right Ear: External ear normal.  Left Ear: External ear normal.  Mouth/Throat: Oropharynx is clear and moist.  Eyes: Conjunctivae are normal. Right eye exhibits no discharge. Left eye exhibits no discharge. No scleral icterus.  Neck: Normal range of  motion. Neck supple. No tracheal deviation present.  Cardiovascular: Normal rate, regular rhythm and intact distal pulses.   Pulmonary/Chest: Effort normal and breath sounds normal. No stridor. No respiratory distress. She has no wheezes. She has no rales.  Abdominal: Soft. Bowel sounds are normal. She exhibits no distension. There is no tenderness. There is no rebound and no guarding.  Musculoskeletal: She exhibits no edema and no tenderness.  Neurological: She is alert and oriented to person, place, and time. She has normal strength. No cranial nerve deficit (No facial droop, extraocular movements intact, tongue midline ) or sensory deficit. She exhibits normal muscle tone. She displays no seizure activity. Coordination normal.  No pronator drift bilateral upper extrem, able to hold both legs off bed for  5 seconds, sensation intact in all extremities, no visual field cuts, no left or right sided neglect, normal finger-nose exam bilaterally, no nystagmus noted   Skin: Skin is warm and dry. No rash noted.  Psychiatric: She has a normal mood and affect.    ED Course  Procedures (including critical care time) Labs Review Labs Reviewed  CBC WITH DIFFERENTIAL  BASIC METABOLIC PANEL    Imaging Review No results found.   EKG Interpretation   Date/Time:  Saturday March 06 2014 11:50:25 EDT Ventricular Rate:  62 PR Interval:  171 QRS Duration: 96 QT Interval:  426 QTC Calculation: 433 R Axis:   87 Text Interpretation:  Sinus rhythm  Low voltage, precordial leads Poor  data quality No previous tracing Confirmed by KNAPP  MD-J, JON ([id number]) on  03/06/2014 12:50:35 PM      MDM   Final diagnoses:  Pain of left upper extremity    The patient's symptoms are suggestive of some sort of radiculopathy. Is not really having any significant symptoms at this time. She has normal strength. Normal movement. Normal sensation.  The patient's EKG was unremarkable. I did discuss the possibility of something like an anginal equivalent. Initially I was planning on doing laboratory testing as well as a chest x-ray.  The patient and her husband did not want to stay in the emergency room. I explained to her that it may take a couple of hours that his test results back. They have family that her visiting at the house. The patient understands that I cannot completely exclude the possibility of subsequent cardiac condition or more emergent etiology for her pain.      Dorie Rank, MD 03/06/14 1314

## 2014-03-06 NOTE — ED Notes (Signed)
Patient reports that she  Had numbness , tingling, pain, and slight swelling to the left arm and hand.

## 2014-03-08 ENCOUNTER — Other Ambulatory Visit: Payer: Medicare Other

## 2014-03-08 ENCOUNTER — Telehealth: Payer: Self-pay | Admitting: *Deleted

## 2014-03-08 DIAGNOSIS — IMO0001 Reserved for inherently not codable concepts without codable children: Secondary | ICD-10-CM | POA: Diagnosis not present

## 2014-03-08 DIAGNOSIS — R7309 Other abnormal glucose: Secondary | ICD-10-CM | POA: Diagnosis not present

## 2014-03-08 NOTE — Telephone Encounter (Signed)
Pt due to have blood work drawn on 03/15/14 for PTH, Calcium and OV to discuss dexa at later visit. Pt currently taking prednisone and wants to know if it will affect the results? Please advise

## 2014-03-08 NOTE — Telephone Encounter (Signed)
Pt aware.

## 2014-03-08 NOTE — Telephone Encounter (Signed)
Should not

## 2014-03-15 ENCOUNTER — Other Ambulatory Visit: Payer: Medicare Other

## 2014-03-15 ENCOUNTER — Ambulatory Visit
Admission: RE | Admit: 2014-03-15 | Discharge: 2014-03-15 | Disposition: A | Discharge status: Home / Self Care | Payer: Medicare Other | Source: Ambulatory Visit | Attending: Internal Medicine | Admitting: Internal Medicine

## 2014-03-15 ENCOUNTER — Other Ambulatory Visit: Payer: Self-pay | Admitting: Internal Medicine

## 2014-03-15 DIAGNOSIS — K5900 Constipation, unspecified: Secondary | ICD-10-CM

## 2014-03-15 DIAGNOSIS — M549 Dorsalgia, unspecified: Secondary | ICD-10-CM | POA: Diagnosis not present

## 2014-03-17 ENCOUNTER — Institutional Professional Consult (permissible substitution): Payer: Medicare Other | Admitting: Gynecology

## 2014-03-24 DIAGNOSIS — R5381 Other malaise: Secondary | ICD-10-CM | POA: Diagnosis not present

## 2014-03-24 DIAGNOSIS — R7309 Other abnormal glucose: Secondary | ICD-10-CM | POA: Diagnosis not present

## 2014-03-24 DIAGNOSIS — R5383 Other fatigue: Secondary | ICD-10-CM | POA: Diagnosis not present

## 2014-04-08 ENCOUNTER — Telehealth: Payer: Self-pay | Admitting: Cardiology

## 2014-04-08 DIAGNOSIS — R0602 Shortness of breath: Secondary | ICD-10-CM | POA: Diagnosis not present

## 2014-04-08 DIAGNOSIS — R5383 Other fatigue: Secondary | ICD-10-CM | POA: Diagnosis not present

## 2014-04-08 DIAGNOSIS — E559 Vitamin D deficiency, unspecified: Secondary | ICD-10-CM | POA: Diagnosis not present

## 2014-04-08 DIAGNOSIS — R5381 Other malaise: Secondary | ICD-10-CM | POA: Diagnosis not present

## 2014-04-08 NOTE — Telephone Encounter (Signed)
New Message  Pt called states that she had an infection on her skin was given antibiotics. She had a reaction from the antibiotic and was given pregnazon. Pt reports she has then had issues with pregnazone and has experienced SOB. Not experiencing it currently. However she requests a call back to discuss

## 2014-04-08 NOTE — Telephone Encounter (Signed)
For the past 6 weeks pt had something removed from her leg then she got an infected . Pt was given prednisone. Pt stop taking prednisone a month ago. The Past few days pt has had shills and  twice for the past two days , and two episodes of  SOB, with mild activity. Pt is fine today. Pt would like to seen soon. Pt states she has been very active and never had SOB, the has been worried her. Pt will see a PA  In her PCP this afternoon. Pt has an appointment with Dr. Luther Parody on 05/28/14 at 3:30 PM. Pt is aware to call if needed. Pt verbalized understanding.

## 2014-04-13 ENCOUNTER — Other Ambulatory Visit: Payer: Self-pay | Admitting: Cardiology

## 2014-04-15 ENCOUNTER — Telehealth: Payer: Self-pay | Admitting: Cardiology

## 2014-04-15 ENCOUNTER — Ambulatory Visit (INDEPENDENT_AMBULATORY_CARE_PROVIDER_SITE_OTHER): Payer: Medicare Other | Admitting: *Deleted

## 2014-04-15 VITALS — BP 126/86 | HR 92 | Ht 65.0 in | Wt 115.4 lb

## 2014-04-15 DIAGNOSIS — I158 Other secondary hypertension: Secondary | ICD-10-CM | POA: Diagnosis not present

## 2014-04-15 NOTE — Patient Instructions (Addendum)
Your physician has requested that you regularly monitor and record your blood pressure readings at home. Please use the same machine at the same time of day to check your readings and record them to bring to your follow-up visit. ° °

## 2014-04-15 NOTE — Progress Notes (Signed)
1.) Reason for visit: blood pressure check.   2.) Name of MD requesting visit: Dr. Marlou Porch  3.) H&P: history of hypertension  4.) ROS related to problem: Pt had recent left lower leg infection & was on prednisone dose pack. ( completed 3 weeks ago)                                                      States blood pressure has been running higher & is very concerned                                                Pt notes decreased energy level & tired more recently.                                                      States she has had recent lab work with pcp & "they were normal"  5.) Assessment and plan per MD:

## 2014-04-15 NOTE — Telephone Encounter (Signed)
New problem   Pt need to speak to nurse concerning high blood pressure reading. Please call pt.

## 2014-04-15 NOTE — Telephone Encounter (Signed)
Pt calls today b/c she states her blood pressure has been running higher at her pcp. States she checks her blood pressure at the pharmacy & it has been running 120/90  ( she does not have a bp cuff at home)  Denies any dizziness, lightheadedness, headache. Have asked her to come in today for a blood pressure check. Pt agrees.  Horton Chin RN

## 2014-04-15 NOTE — Telephone Encounter (Signed)
agree

## 2014-04-20 ENCOUNTER — Telehealth: Payer: Self-pay | Admitting: Cardiology

## 2014-04-20 NOTE — Telephone Encounter (Signed)
New message          Pt would like to know if you showed her bp readings to dr Marlou Porch and give her a call back / pt states this message should be for [facility]

## 2014-04-20 NOTE — Telephone Encounter (Signed)
Pt understands no changes were made to her medications based on recent nurse visit blood pressure check. She does have an appointment with Dr. Marlou Porch in September.  Horton Chin RN

## 2014-04-23 ENCOUNTER — Ambulatory Visit (INDEPENDENT_AMBULATORY_CARE_PROVIDER_SITE_OTHER)
Admission: RE | Admit: 2014-04-23 | Discharge: 2014-04-23 | Disposition: A | Discharge status: Home / Self Care | Payer: Medicare Other | Source: Ambulatory Visit | Attending: Cardiology | Admitting: Cardiology

## 2014-04-23 ENCOUNTER — Encounter: Payer: Self-pay | Admitting: Cardiology

## 2014-04-23 ENCOUNTER — Ambulatory Visit (INDEPENDENT_AMBULATORY_CARE_PROVIDER_SITE_OTHER): Payer: Medicare Other | Admitting: Cardiology

## 2014-04-23 ENCOUNTER — Telehealth: Payer: Self-pay | Admitting: *Deleted

## 2014-04-23 VITALS — BP 142/90 | HR 81 | Ht 65.0 in | Wt 115.0 lb

## 2014-04-23 DIAGNOSIS — I10 Essential (primary) hypertension: Secondary | ICD-10-CM | POA: Diagnosis not present

## 2014-04-23 DIAGNOSIS — R0602 Shortness of breath: Secondary | ICD-10-CM

## 2014-04-23 DIAGNOSIS — I499 Cardiac arrhythmia, unspecified: Secondary | ICD-10-CM

## 2014-04-23 DIAGNOSIS — I498 Other specified cardiac arrhythmias: Secondary | ICD-10-CM | POA: Diagnosis not present

## 2014-04-23 MED ORDER — METOPROLOL SUCCINATE ER 25 MG PO TB24: 25.0000 mg | ORAL_TABLET | Freq: Every day | ORAL | Status: DC

## 2014-04-23 NOTE — Telephone Encounter (Signed)
Called pt to let her know no changes are needed with her BP.  She is complaining of increased SOB on exertion that is scary to her because she has never had anything like this.  The SOB first occurred walking up the stairs and it caused her to have to stop on the stairs to rest.  It has occurred a few other times and it very fatigued.  She would like to be evaluated ASAP.  Scheduled her for today with Dr Marlou Porch at 3:15.

## 2014-04-23 NOTE — Patient Instructions (Signed)
Please increase you Metoprolol to 75 mg a day. Continue all other medications as listed.  Your physician has requested that you have an echocardiogram. Echocardiography is a painless test that uses sound waves to create images of your heart. It provides your doctor with information about the size and shape of your heart and how well your heart's chambers and valves are working. This procedure takes approximately one hour. There are no restrictions for this procedure.  A chest x-ray takes a picture of the organs and structures inside the chest, including the heart, lungs, and blood vessels. This test can show several things, including, whether the heart is enlarges; whether fluid is building up in the lungs; and whether pacemaker / defibrillator leads are still in place.  Follow up as scheduled with Dr Marlou Porch.

## 2014-04-23 NOTE — Progress Notes (Signed)
[facility] [address]. [address] [place], [place]  [address] Phone: [phone] Fax:  [phone]  Date:  04/23/2014   ID:  Vanessa Shaffer, DOB 1941-01-11, MRN [id number]  PCP:  [name], MD   History of Present Illness: Vanessa Shaffer is a 73 y.o. female with previously described ventricular bigeminy seen on Holter monitor with no other adverse arrhythmias, hypertension, hyperlipidemia here for followup.  She underwent exercise treadmill test 6 minutes 30 seconds with occasional PVCs, no ischemic changes. Echocardiogram with normal ejection fraction, mild aortic regurgitation otherwise normal.  HTN recently, mild. Had leg wound, prednisone. Took a while to recover. Something going on with BP. Out of breath going up the steps, very.  Feels very short of breath. She is very worried about this. Anxious. She wants to know what is wrong. She has been her primary doctor a few visits. All blood work including TSH, electrolytes have been normal. Normal hemoglobin. She wants to know what is wrong.    Wt Readings from Last 3 Encounters:  04/23/14 115 lb (52.164 kg)  04/15/14 115 lb 6.4 oz (52.345 kg)  12/02/13 122 lb (55.339 kg)     Past Medical History  Diagnosis Date  . Elevated cholesterol   . Hypertension   . Osteopenia 01/2012    T score -2.1 FRAX 11%/2.4%  . Leiomyoma     Past Surgical History  Procedure Laterality Date  . Breast surgery  1975    Cyst  . Thyroid surgery  1975    Cyst    Current Outpatient Prescriptions  Medication Sig Dispense Refill  . acetaminophen (TYLENOL) 500 MG tablet Take 1,000 mg by mouth every 6 (six) hours as needed for moderate pain.      Marland Kitchen atorvastatin (LIPITOR) 10 MG tablet Take 10 mg by mouth daily.      . Calcium Carbonate-Vitamin D (CALCIUM + D PO) Take by mouth daily.        Marland Kitchen ibuprofen (ADVIL,MOTRIN) 200 MG tablet Take 400-600 mg by mouth every 6 (six) hours as needed for moderate pain.      . metoprolol succinate  (TOPROL-XL) 50 MG 24 hr tablet TAKE 1 TABLET ONCE DAILY.  30 tablet  1  . NONFORMULARY OR COMPOUNDED ITEM Vaginal estrogen 0.02% cream twice weekly  90 each  3   No current facility-administered medications for this visit.    Allergies:    Allergies  Allergen Reactions  . Sulfa Antibiotics Hives  . Terazol [Terconazole] Itching  . Ambien [Zolpidem Tartrate] Palpitations  . Codeine Palpitations    Social History:  The patient  reports that she has never smoked. She does not have any smokeless tobacco history on file. She reports that she drinks alcohol. She reports that she does not use illicit drugs.   ROS:  Please see the history of present illness.   Denies any fevers, chills, orthopnea, PND  PHYSICAL EXAM: VS:  BP 142/90  Pulse 81  Ht 5\' 5"  (1.651 m)  Wt 115 lb (52.164 kg)  BMI 19.14 kg/m2  SpO2 99% Well nourished, well developed, in no acute distress HEENT: normal Neck: no JVD Cardiac:  normal S1, S2; RRR; no murmur Lungs:  clear to auscultation bilaterally, no wheezing, rhonchi or rales Abd: soft, nontender, no hepatomegaly Ext: no edema Skin: warm and dry Neuro: no focal abnormalities noted  EKG:  Prior EKG demonstrated ventricular bigeminy.     ASSESSMENT AND PLAN:  1. Shortness of breath-I will  check an echocardiogram to ensure proper structure and function of her heart. I will also check a chest x-ray to ensure that there is no evidence of lung abnormality. She is worried about the possibility of pneumonia. Anxious, recent blood work has been excellent. She says that she has no energy. 2. Ventricular bigeminy/palpitations-overall reasonable control with metoprolol/beta blocker. We will continue. Doing very well. 3. Hypertension-mildly elevated recently. She is very concerned about this. Anxious. I will increase her metoprolol to 75 mg. 4. Aortic regurgitation-mild in severity. Monitor clinically. 5. Hyperlipidemia-continue with statin. Prior LDL  95 6. Insomnia-suggested trying metoprolol in the evening hours. 7. Followup appointment as previously scheduled  Signed, Candee Furbish, MD [facility]  04/23/2014 3:47 PM

## 2014-04-23 NOTE — Telephone Encounter (Signed)
Message copied by Shellia Cleverly on Fri Apr 23, 2014  8:40 AM ------      Message from: Verna Czech      Created: Fri Apr 16, 2014 12:01 PM      Regarding: FW: blood pressure                   ----- Message -----         From: Candee Furbish, MD         Sent: 04/15/2014   5:57 PM           To: Verna Czech, RN      Subject: RE: blood pressure                                       BP looks reassuring on encounter today. Continue with same plan.             Candee Furbish, MD            ----- Message -----         From: Verna Czech, RN         Sent: 04/15/2014   4:20 PM           To: Candee Furbish, MD      Subject: blood pressure                                           Dr. Marlou Porch            I asked pt to come in today for blood pressure check b/c she stated her blood pressure had been running higher lately.            Please see encounter sent to you.      She takes her Toprol 50mg  each morning around 8 am. Her visit today 3:40pm            Horton Chin RN             ------

## 2014-04-27 ENCOUNTER — Ambulatory Visit (HOSPITAL_COMMUNITY): Payer: Medicare Other | Attending: Cardiology

## 2014-04-27 DIAGNOSIS — R0602 Shortness of breath: Secondary | ICD-10-CM | POA: Diagnosis not present

## 2014-04-27 DIAGNOSIS — I359 Nonrheumatic aortic valve disorder, unspecified: Secondary | ICD-10-CM | POA: Diagnosis not present

## 2014-04-27 NOTE — Progress Notes (Signed)
2D Echo completed. 04/27/2014

## 2014-05-26 DIAGNOSIS — Z23 Encounter for immunization: Secondary | ICD-10-CM | POA: Diagnosis not present

## 2014-05-28 ENCOUNTER — Ambulatory Visit (INDEPENDENT_AMBULATORY_CARE_PROVIDER_SITE_OTHER): Payer: Medicare Other | Admitting: Cardiology

## 2014-05-28 ENCOUNTER — Encounter: Payer: Self-pay | Admitting: Cardiology

## 2014-05-28 VITALS — BP 130/90 | HR 79 | Ht 65.0 in | Wt 116.0 lb

## 2014-05-28 DIAGNOSIS — R5383 Other fatigue: Secondary | ICD-10-CM | POA: Insufficient documentation

## 2014-05-28 DIAGNOSIS — I10 Essential (primary) hypertension: Secondary | ICD-10-CM | POA: Diagnosis not present

## 2014-05-28 DIAGNOSIS — R0989 Other specified symptoms and signs involving the circulatory and respiratory systems: Secondary | ICD-10-CM

## 2014-05-28 DIAGNOSIS — R5381 Other malaise: Secondary | ICD-10-CM

## 2014-05-28 DIAGNOSIS — R0609 Other forms of dyspnea: Secondary | ICD-10-CM | POA: Diagnosis not present

## 2014-05-28 DIAGNOSIS — Z79899 Other long term (current) drug therapy: Secondary | ICD-10-CM | POA: Diagnosis not present

## 2014-05-28 DIAGNOSIS — R0600 Dyspnea, unspecified: Secondary | ICD-10-CM | POA: Insufficient documentation

## 2014-05-28 LAB — CBC
HEMATOCRIT: 39.1 % (ref 36.0–46.0)
Hemoglobin: 13.2 g/dL (ref 12.0–15.0)
MCHC: 33.7 g/dL (ref 30.0–36.0)
MCV: 93.9 fl (ref 78.0–100.0)
Platelets: 216 10*3/uL (ref 150.0–400.0)
RBC: 4.17 Mil/uL (ref 3.87–5.11)
RDW: 14.1 % (ref 11.5–15.5)
WBC: 6.6 10*3/uL (ref 4.0–10.5)

## 2014-05-28 LAB — BASIC METABOLIC PANEL
BUN: 15 mg/dL (ref 6–23)
CALCIUM: 9.4 mg/dL (ref 8.4–10.5)
CO2: 26 mEq/L (ref 19–32)
Chloride: 102 mEq/L (ref 96–112)
Creatinine, Ser: 1 mg/dL (ref 0.4–1.2)
GFR: 61.3 mL/min (ref >60.00)
GLUCOSE: 139 mg/dL — AB (ref 70–99)
Potassium: 3.6 mEq/L (ref 3.5–5.1)
Sodium: 138 mEq/L (ref 135–145)

## 2014-05-28 LAB — SEDIMENTATION RATE: Sed Rate: 22 mm/hr (ref 0–22)

## 2014-05-28 MED ORDER — METOPROLOL SUCCINATE ER 50 MG PO TB24: ORAL_TABLET | ORAL | Status: DC

## 2014-05-28 NOTE — Progress Notes (Signed)
[place]. [address], [address] [place], [place]  [address] Phone: [phone] Fax:  [phone]  Date:  05/28/2014   ID:  SAMAURI KELLENBERGER, DOB 06/07/1941, MRN [id number]  PCP:  [name], MD   History of Present Illness: MADISEN LUDVIGSEN is a 73 y.o. female with previously described ventricular bigeminy seen on Holter monitor with no other adverse arrhythmias, hypertension, hyperlipidemia here for evaluation of shortness of breath with minimal activity which is been present over the past 2 days.  In June 2015 had leg basal cell removed. Prednisone. Too much. Joint pain. Fatigue. Out routine of exercising. Lasted 6 weeks. BP spiked. We added medication. Not helping. She is trying to play golf again. Pain in calf, numbness in toes. Little fatigue. Sometimes a little shortness of breath. Pharmacist is concerned. Thinks Lipitor. ? Blood work. No fever, no night sweats.   She underwent exercise treadmill test 6 minutes 30 seconds with occasional PVCs, no ischemic changes. Echocardiogram with normal ejection fraction, mild aortic regurgitation otherwise normal. Chest x-ray normal.  HTN recently, mild. Had leg wound, prednisone. Took a while to recover.   Wt Readings from Last 3 Encounters:  05/28/14 116 lb (52.617 kg)  04/23/14 115 lb (52.164 kg)  04/15/14 115 lb 6.4 oz (52.345 kg)     Past Medical History  Diagnosis Date  . Elevated cholesterol   . Hypertension   . Osteopenia 01/2012    T score -2.1 FRAX 11%/2.4%  . Leiomyoma     Past Surgical History  Procedure Laterality Date  . Breast surgery  1975    Cyst  . Thyroid surgery  1975    Cyst    Current Outpatient Prescriptions  Medication Sig Dispense Refill  . acetaminophen (TYLENOL) 500 MG tablet Take 1,000 mg by mouth every 6 (six) hours as needed for moderate pain.      Marland Kitchen atorvastatin (LIPITOR) 10 MG tablet Take 10 mg by mouth daily.      . Calcium Carbonate-Vitamin D (CALCIUM + D PO) Take by mouth daily.         Marland Kitchen ibuprofen (ADVIL,MOTRIN) 200 MG tablet Take 400-600 mg by mouth every 6 (six) hours as needed for moderate pain.      . metoprolol succinate (TOPROL XL) 25 MG 24 hr tablet Take 1 tablet (25 mg total) by mouth daily.  30 tablet  11  . metoprolol succinate (TOPROL-XL) 50 MG 24 hr tablet TAKE 1 TABLET ONCE DAILY.  30 tablet  1  . NONFORMULARY OR COMPOUNDED ITEM Vaginal estrogen 0.02% cream twice weekly  90 each  3   No current facility-administered medications for this visit.    Allergies:    Allergies  Allergen Reactions  . Sulfa Antibiotics Hives  . Terazol [Terconazole] Itching  . Ambien [Zolpidem Tartrate] Palpitations  . Codeine Palpitations    Social History:  The patient  reports that she has never smoked. She does not have any smokeless tobacco history on file. She reports that she drinks alcohol. She reports that she does not use illicit drugs.   ROS:  Please see the history of present illness.   Denies any fevers, chills, orthopnea, PND  PHYSICAL EXAM: VS:  BP 130/90  Pulse 79  Ht 5' 5"  (1.651 m)  Wt 116 lb (52.617 kg)  BMI 19.30 kg/m2 Well nourished, well developed, in no acute distress HEENT: normal Neck: no JVD Cardiac:  normal S1, S2; RRR; no murmur Lungs:  clear to auscultation  bilaterally, no wheezing, rhonchi or rales Abd: soft, nontender, no hepatomegaly Ext: no edema Skin: warm and dry Neuro: no focal abnormalities noted  EKG:  Prior EKG demonstrated ventricular bigeminy.     ASSESSMENT AND PLAN:  1. Shortness of breath-ECHO and ETT reassuring 04/27/14. No pulm HTN.  No abnormalities on chest x-ray Anxious, recent blood work has been excellent. She says that she has no energy. I will check a CBC, ESR, basic metabolic profile. She did have a previous foot infection over the summer. I do not think that she has infective endocarditis because she's not having persistent fevers, night sweats. 2. Ventricular bigeminy/palpitations-overall reasonable control  with metoprolol/beta blocker. We will continue.  3. Hypertension-mildly elevated recently. She is very concerned about this. Anxious. On repeat, 138/86. Reassuring. Dr. Lysle Rubens can continue to monitor and add medications as necessary. 4. Aortic regurgitation-mild in severity. Monitor clinically. Of no clinical consequence. 5. Hyperlipidemia-concerned that her leg pain may be secondary to atorvastatin. I've encouraged her to stop this medication for 2 weeks, review her symptoms. If pain resolves or does not, restart Lipitor and continue to monitor.  Prior LDL 95 6. Insomnia-suggested trying metoprolol in the evening hours. 7. BMI 18-encourage weight gain of at least 5 pounds. Continue with walking, exercise 8. Cardiac workup reassuring. As needed followup.  Signed, Candee Furbish, MD [facility]  05/28/2014 2:41 PM

## 2014-05-28 NOTE — Patient Instructions (Signed)
The current medical regimen is effective;  continue present plan and medications. Please hold your Lipitor for 2 weeks to see if muscle aches improve.  If they do not - restart the Lipitor.  Please have blood work today (ESR, CBC and BMP)  Follow up as needed with Dr Marlou Porch.

## 2014-06-09 DIAGNOSIS — R209 Unspecified disturbances of skin sensation: Secondary | ICD-10-CM | POA: Diagnosis not present

## 2014-06-09 DIAGNOSIS — I10 Essential (primary) hypertension: Secondary | ICD-10-CM | POA: Diagnosis not present

## 2014-06-09 DIAGNOSIS — Z1331 Encounter for screening for depression: Secondary | ICD-10-CM | POA: Diagnosis not present

## 2014-06-12 ENCOUNTER — Other Ambulatory Visit: Payer: Self-pay | Admitting: Cardiology

## 2014-06-16 DIAGNOSIS — Z1231 Encounter for screening mammogram for malignant neoplasm of breast: Secondary | ICD-10-CM | POA: Diagnosis not present

## 2014-06-21 ENCOUNTER — Encounter: Payer: Self-pay | Admitting: Gynecology

## 2014-07-01 ENCOUNTER — Other Ambulatory Visit: Payer: Self-pay | Admitting: *Deleted

## 2014-07-01 DIAGNOSIS — M8580 Other specified disorders of bone density and structure, unspecified site: Secondary | ICD-10-CM

## 2014-07-12 ENCOUNTER — Encounter: Payer: Self-pay | Admitting: Cardiology

## 2014-08-12 DIAGNOSIS — M25551 Pain in right hip: Secondary | ICD-10-CM | POA: Diagnosis not present

## 2014-08-12 DIAGNOSIS — M545 Low back pain: Secondary | ICD-10-CM | POA: Diagnosis not present

## 2014-08-16 DIAGNOSIS — E780 Pure hypercholesterolemia: Secondary | ICD-10-CM | POA: Diagnosis not present

## 2014-08-16 DIAGNOSIS — Z23 Encounter for immunization: Secondary | ICD-10-CM | POA: Diagnosis not present

## 2014-08-16 DIAGNOSIS — Z1389 Encounter for screening for other disorder: Secondary | ICD-10-CM | POA: Diagnosis not present

## 2014-08-16 DIAGNOSIS — M79606 Pain in leg, unspecified: Secondary | ICD-10-CM | POA: Diagnosis not present

## 2014-08-16 DIAGNOSIS — I10 Essential (primary) hypertension: Secondary | ICD-10-CM | POA: Diagnosis not present

## 2014-08-16 DIAGNOSIS — Z0000 Encounter for general adult medical examination without abnormal findings: Secondary | ICD-10-CM | POA: Diagnosis not present

## 2014-08-16 DIAGNOSIS — R7309 Other abnormal glucose: Secondary | ICD-10-CM | POA: Diagnosis not present

## 2014-08-16 DIAGNOSIS — N182 Chronic kidney disease, stage 2 (mild): Secondary | ICD-10-CM | POA: Diagnosis not present

## 2014-09-15 DIAGNOSIS — D239 Other benign neoplasm of skin, unspecified: Secondary | ICD-10-CM | POA: Diagnosis not present

## 2014-09-15 DIAGNOSIS — Z85828 Personal history of other malignant neoplasm of skin: Secondary | ICD-10-CM | POA: Diagnosis not present

## 2014-09-15 DIAGNOSIS — D1801 Hemangioma of skin and subcutaneous tissue: Secondary | ICD-10-CM | POA: Diagnosis not present

## 2014-09-15 DIAGNOSIS — D485 Neoplasm of uncertain behavior of skin: Secondary | ICD-10-CM | POA: Diagnosis not present

## 2014-09-15 DIAGNOSIS — L821 Other seborrheic keratosis: Secondary | ICD-10-CM | POA: Diagnosis not present

## 2014-10-07 DIAGNOSIS — M545 Low back pain: Secondary | ICD-10-CM | POA: Diagnosis not present

## 2014-10-07 DIAGNOSIS — M6281 Muscle weakness (generalized): Secondary | ICD-10-CM | POA: Diagnosis not present

## 2014-10-07 DIAGNOSIS — H00025 Hordeolum internum left lower eyelid: Secondary | ICD-10-CM | POA: Diagnosis not present

## 2014-10-13 ENCOUNTER — Telehealth: Payer: Self-pay | Admitting: *Deleted

## 2014-10-13 NOTE — Telephone Encounter (Signed)
Left message for pt to call back to discuss her request thru the insurance company for a lower tier beta-blocker.  Metoprolol succinate tab sr (both 25 mg and 50 mg) are tier 1 and there is not a lower cost medication.

## 2014-10-13 NOTE — Telephone Encounter (Signed)
Discussed with pt that Metoprolol succinate tabs are generic and a tier 1.  The formulary for [facility] 2016 online shows its tier 1 and the customer service rep there states it is a tier 1.  Requested pt for her to have pharmacy rerun the RX and/or verify with her insurance company.  She or the pharmacy can call back with any further questions or concerns.

## 2014-10-14 DIAGNOSIS — H00025 Hordeolum internum left lower eyelid: Secondary | ICD-10-CM | POA: Diagnosis not present

## 2014-10-14 DIAGNOSIS — H00022 Hordeolum internum right lower eyelid: Secondary | ICD-10-CM | POA: Diagnosis not present

## 2014-10-18 ENCOUNTER — Telehealth: Payer: Self-pay | Admitting: Cardiology

## 2014-10-18 DIAGNOSIS — H0015 Chalazion left lower eyelid: Secondary | ICD-10-CM | POA: Diagnosis not present

## 2014-10-18 NOTE — Telephone Encounter (Signed)
I spoke with Rica Mote at [place] [phone]. He states that metoprolol succinate is Tier 2 on pt's formulary.

## 2014-10-18 NOTE — Telephone Encounter (Signed)
I was transferred to [place].  She states metoprolol succinate is a Tier 2. We completed request for Tier exception over the telephone.  She states that it will be reviewed within 24 hours and the patient should get a telephone call with a decision within 24 hours.  Pt advised, appreciated our help.

## 2014-10-18 NOTE — Addendum Note (Signed)
Addended by: Katrine Coho on: 10/18/2014 11:11 AM   Modules accepted: Orders

## 2014-10-18 NOTE — Telephone Encounter (Signed)
New Msg        Pt c/o medication issue:  1. Name of Medication: Metoprolol   2. How are you currently taking this medication (dosage and times per day)? 25 mg once a day  3. Are you having a reaction (difficulty breathing--STAT)? No  4. What is your medication issue? [employer] requesting tier exception for the medication. Fax was sent a week ago. Member out meds.  Norva Riffle can be reached at [phone] use name and today's date for reference, may speak with anyone.

## 2014-10-19 ENCOUNTER — Telehealth: Payer: Self-pay | Admitting: Cardiology

## 2014-10-19 NOTE — Telephone Encounter (Signed)
New Message        Pt calling stating that a separate form of request for Metoprol 25mg  needs to be faxed to [facility] in regards to it being a Teir 1 med. Pt states she is going to call Dr. Deforest Hoyles to get him to take over all of these meds. Please call back if you have any questions.

## 2014-10-19 NOTE — Telephone Encounter (Signed)
Noted  

## 2014-10-19 NOTE — Telephone Encounter (Signed)
New Msg      [employer] calling,   There request for metoprolol has been approved for 1 yr as of Feb 8th member has been notified.   No request received for 25 mg of Metoprolol, letter will be sent in regards to this.   Rep didn't want to leave return number, just msg.

## 2014-10-21 DIAGNOSIS — M545 Low back pain: Secondary | ICD-10-CM | POA: Diagnosis not present

## 2014-10-21 DIAGNOSIS — M6281 Muscle weakness (generalized): Secondary | ICD-10-CM | POA: Diagnosis not present

## 2014-10-27 NOTE — Telephone Encounter (Signed)
Still haven't received form for 25 mg Metoprolol tier exception.  Insurance company to fax it today.

## 2014-10-28 NOTE — Telephone Encounter (Signed)
Received paperwork and it has been completed.  I have given it to Dr Marlou Porch to be signed and will fax it as soon as it has been.

## 2014-10-29 NOTE — Telephone Encounter (Signed)
Paperwork for Tier exception for Metoprolol succinate has been faxed.

## 2014-12-15 ENCOUNTER — Ambulatory Visit (INDEPENDENT_AMBULATORY_CARE_PROVIDER_SITE_OTHER): Payer: Medicare Other | Admitting: Gynecology

## 2014-12-15 ENCOUNTER — Telehealth: Payer: Self-pay | Admitting: *Deleted

## 2014-12-15 ENCOUNTER — Encounter: Payer: Self-pay | Admitting: Gynecology

## 2014-12-15 VITALS — BP 110/66 | Ht 65.0 in | Wt 120.0 lb

## 2014-12-15 DIAGNOSIS — Z01419 Encounter for gynecological examination (general) (routine) without abnormal findings: Secondary | ICD-10-CM | POA: Diagnosis not present

## 2014-12-15 DIAGNOSIS — M8580 Other specified disorders of bone density and structure, unspecified site: Secondary | ICD-10-CM

## 2014-12-15 DIAGNOSIS — N952 Postmenopausal atrophic vaginitis: Secondary | ICD-10-CM

## 2014-12-15 DIAGNOSIS — N63 Unspecified lump in unspecified breast: Secondary | ICD-10-CM

## 2014-12-15 MED ORDER — NONFORMULARY OR COMPOUNDED ITEM: Status: DC

## 2014-12-15 NOTE — Patient Instructions (Signed)
Office will call you to arrange the ultrasound/mammogram. Call if you do not hear within the week.  You may obtain a copy of any labs that were done today by logging onto MyChart as outlined in the instructions provided with your AVS (after visit summary). The office will not call with normal lab results but certainly if there are any significant abnormalities then we will contact you.   Health Maintenance, Female A healthy lifestyle and preventative care can promote health and wellness.  Maintain regular health, dental, and eye exams.  Eat a healthy diet. Foods like vegetables, fruits, whole grains, low-fat dairy products, and lean protein foods contain the nutrients you need without too many calories. Decrease your intake of foods high in solid fats, added sugars, and salt. Get information about a proper diet from your caregiver, if necessary.  Regular physical exercise is one of the most important things you can do for your health. Most adults should get at least 150 minutes of moderate-intensity exercise (any activity that increases your heart rate and causes you to sweat) each week. In addition, most adults need muscle-strengthening exercises on 2 or more days a week.   Maintain a healthy weight. The body mass index (BMI) is a screening tool to identify possible weight problems. It provides an estimate of body fat based on height and weight. Your caregiver can help determine your BMI, and can help you achieve or maintain a healthy weight. For adults 20 years and older:  A BMI below 18.5 is considered underweight.  A BMI of 18.5 to 24.9 is normal.  A BMI of 25 to 29.9 is considered overweight.  A BMI of 30 and above is considered obese.  Maintain normal blood lipids and cholesterol by exercising and minimizing your intake of saturated fat. Eat a balanced diet with plenty of fruits and vegetables. Blood tests for lipids and cholesterol should begin at age 77 and be repeated every 5 years.  If your lipid or cholesterol levels are high, you are over 50, or you are a high risk for heart disease, you may need your cholesterol levels checked more frequently.Ongoing high lipid and cholesterol levels should be treated with medicines if diet and exercise are not effective.  If you smoke, find out from your caregiver how to quit. If you do not use tobacco, do not start.  Lung cancer screening is recommended for adults aged 84 80 years who are at high risk for developing lung cancer because of a history of smoking. Yearly low-dose computed tomography (CT) is recommended for people who have at least a 30-pack-year history of smoking and are a current smoker or have quit within the past 15 years. A pack year of smoking is smoking an average of 1 pack of cigarettes a day for 1 year (for example: 1 pack a day for 30 years or 2 packs a day for 15 years). Yearly screening should continue until the smoker has stopped smoking for at least 15 years. Yearly screening should also be stopped for people who develop a health problem that would prevent them from having lung cancer treatment.  If you are pregnant, do not drink alcohol. If you are breastfeeding, be very cautious about drinking alcohol. If you are not pregnant and choose to drink alcohol, do not exceed 1 drink per day. One drink is considered to be 12 ounces (355 mL) of beer, 5 ounces (148 mL) of wine, or 1.5 ounces (44 mL) of liquor.  Avoid use of street  drugs. Do not share needles with anyone. Ask for help if you need support or instructions about stopping the use of drugs.  High blood pressure causes heart disease and increases the risk of stroke. Blood pressure should be checked at least every 1 to 2 years. Ongoing high blood pressure should be treated with medicines, if weight loss and exercise are not effective.  If you are 25 to 74 years old, ask your caregiver if you should take aspirin to prevent strokes.  Diabetes screening involves  taking a blood sample to check your fasting blood sugar level. This should be done once every 3 years, after age 17, if you are within normal weight and without risk factors for diabetes. Testing should be considered at a younger age or be carried out more frequently if you are overweight and have at least 1 risk factor for diabetes.  Breast cancer screening is essential preventative care for women. You should practice "breast self-awareness." This means understanding the normal appearance and feel of your breasts and may include breast self-examination. Any changes detected, no matter how small, should be reported to a caregiver. Women in their 1s and 30s should have a clinical breast exam (CBE) by a caregiver as part of a regular health exam every 1 to 3 years. After age 82, women should have a CBE every year. Starting at age 39, women should consider having a mammogram (breast X-ray) every year. Women who have a family history of breast cancer should talk to their caregiver about genetic screening. Women at a high risk of breast cancer should talk to their caregiver about having an MRI and a mammogram every year.  Breast cancer gene (BRCA)-related cancer risk assessment is recommended for women who have family members with BRCA-related cancers. BRCA-related cancers include breast, ovarian, tubal, and peritoneal cancers. Having family members with these cancers may be associated with an increased risk for harmful changes (mutations) in the breast cancer genes BRCA1 and BRCA2. Results of the assessment will determine the need for genetic counseling and BRCA1 and BRCA2 testing.  The Pap test is a screening test for cervical cancer. Women should have a Pap test starting at age 41. Between ages 64 and 22, Pap tests should be repeated every 2 years. Beginning at age 53, you should have a Pap test every 3 years as long as the past 3 Pap tests have been normal. If you had a hysterectomy for a problem that was not  cancer or a condition that could lead to cancer, then you no longer need Pap tests. If you are between ages 54 and 66, and you have had normal Pap tests going back 10 years, you no longer need Pap tests. If you have had past treatment for cervical cancer or a condition that could lead to cancer, you need Pap tests and screening for cancer for at least 20 years after your treatment. If Pap tests have been discontinued, risk factors (such as a new sexual partner) need to be reassessed to determine if screening should be resumed. Some women have medical problems that increase the chance of getting cervical cancer. In these cases, your caregiver may recommend more frequent screening and Pap tests.  The human papillomavirus (HPV) test is an additional test that may be used for cervical cancer screening. The HPV test looks for the virus that can cause the cell changes on the cervix. The cells collected during the Pap test can be tested for HPV. The HPV test could be  used to screen women aged 10 years and older, and should be used in women of any age who have unclear Pap test results. After the age of 107, women should have HPV testing at the same frequency as a Pap test.  Colorectal cancer can be detected and often prevented. Most routine colorectal cancer screening begins at the age of 43 and continues through age 60. However, your caregiver may recommend screening at an earlier age if you have risk factors for colon cancer. On a yearly basis, your caregiver may provide home test kits to check for hidden blood in the stool. Use of a small camera at the end of a tube, to directly examine the colon (sigmoidoscopy or colonoscopy), can detect the earliest forms of colorectal cancer. Talk to your caregiver about this at age 41, when routine screening begins. Direct examination of the colon should be repeated every 5 to 10 years through age 13, unless early forms of pre-cancerous polyps or small growths are  found.  Hepatitis C blood testing is recommended for all people born from [birth year] through 1965 and any individual with known risks for hepatitis C.  Practice safe sex. Use condoms and avoid high-risk sexual practices to reduce the spread of sexually transmitted infections (STIs). Sexually active women aged 79 and younger should be checked for Chlamydia, which is a common sexually transmitted infection. Older women with new or multiple partners should also be tested for Chlamydia. Testing for other STIs is recommended if you are sexually active and at increased risk.  Osteoporosis is a disease in which the bones lose minerals and strength with aging. This can result in serious bone fractures. The risk of osteoporosis can be identified using a bone density scan. Women ages 58 and over and women at risk for fractures or osteoporosis should discuss screening with their caregivers. Ask your caregiver whether you should be taking a calcium supplement or vitamin D to reduce the rate of osteoporosis.  Menopause can be associated with physical symptoms and risks. Hormone replacement therapy is available to decrease symptoms and risks. You should talk to your caregiver about whether hormone replacement therapy is right for you.  Use sunscreen. Apply sunscreen liberally and repeatedly throughout the day. You should seek shade when your shadow is shorter than you. Protect yourself by wearing long sleeves, pants, a wide-brimmed hat, and sunglasses year round, whenever you are outdoors.  Notify your caregiver of new moles or changes in moles, especially if there is a change in shape or color. Also notify your caregiver if a mole is larger than the size of a pencil eraser.  Stay current with your immunizations. Document Released: 03/12/2011 Document Revised: 12/22/2012 Document Reviewed: 03/12/2011 [facility] Patient Information 2014 [place].

## 2014-12-15 NOTE — Telephone Encounter (Signed)
Rx called into pharmacy, appointment on 12/17/14 @ 8:00am pt informed. Order faxed

## 2014-12-15 NOTE — Progress Notes (Signed)
ZYANYA GLAZA 04/15/1941 [id number]        74 y.o.  G2P2002 for breast and pelvic exam.  Several issues noted below.  Past medical history,surgical history, problem list, medications, allergies, family history and social history were all reviewed and documented as reviewed in the EPIC chart.  ROS:  Performed with pertinent positives and negatives included in the history, assessment and plan.   Additional significant findings :  none   Exam: Kim Counsellor Vitals:   12/15/14 0950  BP: 110/66  Height: 5\' 5"  (1.651 m)  Weight: 120 lb (54.432 kg)   General appearance:  Normal affect, orientation and appearance. Skin: Grossly normal HEENT: Without gross lesions.  No cervical or supraclavicular adenopathy. Thyroid normal.  Lungs:  Clear without wheezing, rales or rhonchi Cardiac: RR, without RMG Abdominal:  Soft, nontender, without masses, guarding, rebound, organomegaly or hernia Breasts:  Examined lying and sitting. Left without masses, retractions, discharge or axillary adenopathy. Right with 2 cm nodularity tail of Spence. No overlying skin changes, nipple discharge or axillary adenopathy Physical Exam  Pulmonary/Chest:      Pelvic:  Ext/BUS/vagina with atrophic changes  Cervix with atrophic changes  Uterus axial to anteverted. Generous in size, midline and mobile nontender   Adnexa  Without masses or tenderness    Anus and perineum  Normal   Rectovaginal  Normal sphincter tone without palpated masses or tenderness.    Assessment/Plan:  74 y.o. [id number] female for breast and pelvic exam.   1. Right breast nodularity. Not appreciated by patient. Mammogram this past fall normal. Schedule diagnostic mammogram and ultrasound. Differential and importance of follow up reviewed with patient. 2. Postmenopausal/atrophic genital changes. Using formulated estradiol cream twice weekly with good results. Patient wants to continue. Not having significant hot flashes or night  sweats. No vaginal bleeding. Refill 1 year provided. Call if any vaginal bleeding or other issues. 3. Osteopenia. DEXA 2015 T score -2.3 stable from prior DEXA.  FRAX 13%/3.4%. This was evaluated by Dr. Toney Rakes who recommended office visit to discuss treatment options but patient did not follow up for this. I reviewed the increased hip fracture risk exceeding 3%. Options for treatment to include oral bisphosphonates reviewed. Patient is very reluctant to consider treatment at this time. She is active and exercises on a regular basis. Should prefer to wait and restudy next year at 2 year interval and then decide. Increased calcium vitamin D reviewed. Check vitamin D level today. 4. History of leiomyoma. Uterus generous in size stable from prior exams. Continue to monitor with annual exams. 5. Pap smear 11/2013 negative. No Pap smear done today. Options to stop screening as she is over the age of 68 versus less frequent screening intervals reviewed. Will readdress on annual basis. 6. Colonoscopy 2014. Repeat at their recommended interval. 7. Health maintenance. No routine blood work done as this is done at her primary physician's office. Follow up in one year, sooner as needed.     [name] MD, 10:31 AM 12/15/2014

## 2014-12-15 NOTE — Telephone Encounter (Signed)
-----   Message from [name], MD sent at 12/15/2014 10:37 AM EDT ----- #1 call in refill to [facility] for formulated estradiol vaginal cream twice weekly. Refill 1 year  #2 schedule diagnostic mammography and ultrasound at [facility] reference new right breast mass tail of Spence.

## 2014-12-16 LAB — URINALYSIS W MICROSCOPIC + REFLEX CULTURE
BILIRUBIN URINE: NEGATIVE
Bacteria, UA: NONE SEEN
CRYSTALS: NONE SEEN
Casts: NONE SEEN
Glucose, UA: NEGATIVE mg/dL
Hgb urine dipstick: NEGATIVE
KETONES UR: NEGATIVE mg/dL
Leukocytes, UA: NEGATIVE
Nitrite: NEGATIVE
PROTEIN: NEGATIVE mg/dL
SPECIFIC GRAVITY, URINE: 1.013 (ref 1.005–1.030)
Urobilinogen, UA: 0.2 mg/dL (ref 0.0–1.0)
pH: 6.5 (ref 5.0–8.0)

## 2014-12-16 LAB — VITAMIN D 25 HYDROXY (VIT D DEFICIENCY, FRACTURES): VIT D 25 HYDROXY: 31 ng/mL (ref 30–100)

## 2014-12-17 DIAGNOSIS — N63 Unspecified lump in breast: Secondary | ICD-10-CM | POA: Diagnosis not present

## 2014-12-21 ENCOUNTER — Encounter: Payer: Self-pay | Admitting: Gynecology

## 2015-01-25 DIAGNOSIS — Z111 Encounter for screening for respiratory tuberculosis: Secondary | ICD-10-CM | POA: Diagnosis not present

## 2015-02-02 DIAGNOSIS — H2513 Age-related nuclear cataract, bilateral: Secondary | ICD-10-CM | POA: Diagnosis not present

## 2015-02-02 DIAGNOSIS — D2311 Other benign neoplasm of skin of right eyelid, including canthus: Secondary | ICD-10-CM | POA: Diagnosis not present

## 2015-02-02 DIAGNOSIS — H52223 Regular astigmatism, bilateral: Secondary | ICD-10-CM | POA: Diagnosis not present

## 2015-02-02 DIAGNOSIS — H5213 Myopia, bilateral: Secondary | ICD-10-CM | POA: Diagnosis not present

## 2015-02-09 ENCOUNTER — Ambulatory Visit (INDEPENDENT_AMBULATORY_CARE_PROVIDER_SITE_OTHER): Payer: Medicare Other | Admitting: Gynecology

## 2015-02-09 ENCOUNTER — Encounter: Payer: Self-pay | Admitting: Gynecology

## 2015-02-09 VITALS — BP 110/70

## 2015-02-09 DIAGNOSIS — N644 Mastodynia: Secondary | ICD-10-CM | POA: Diagnosis not present

## 2015-02-09 NOTE — Patient Instructions (Signed)
Office will call you to arrange the appointment with Dr. Armandina Gemma.

## 2015-02-09 NOTE — Progress Notes (Signed)
Vanessa Shaffer 04-27-1941 [id number]        74 y.o.  G2P2002 Patient presents for follow up breast exam.  History of palpated right tail of Spence nodularity at recent annual exam. Follow up diagnostic mammography and ultrasound were negative. I asked her to return for reexamination. Patient notes that she does feel an area of nodularity which has persisted with some tenderness.  Past medical history,surgical history, problem list, medications, allergies, family history and social history were all reviewed and documented in the EPIC chart.  Directed ROS with pertinent positives and negatives documented in the history of present illness/assessment and plan.  Exam: Kim assistant Filed Vitals:   02/09/15 1130  BP: 110/70   General appearance:  Normal Both breasts examined lying and sitting without masses retractions discharge adenopathy. The right tail of Spence area does have some increased density noted true masses. Ultrasound over this area was negative as was mammography.  Assessment/Plan:  74 y.o. [id number] with history and exam as above. Patient feels that she can feel a definite area of nodularity on self breast exam. Options to include observation for now with more aggressive evaluation if persists over the next several months versus second opinion general surgical exam now discussed. Patient and I would feel more comfortable with her having a second opinion now we will make arrangements for her to see a general surgeon. She requested that I make this with Dr. Harlow Asa who has cared for her husband in the past.    [name] MD, 11:59 AM 02/09/2015

## 2015-02-10 ENCOUNTER — Telehealth: Payer: Self-pay | Admitting: *Deleted

## 2015-02-10 NOTE — Telephone Encounter (Signed)
Left message for Janett Billow  to call me regarding the below.

## 2015-02-10 NOTE — Telephone Encounter (Signed)
Pt informed with the below. 

## 2015-02-10 NOTE — Telephone Encounter (Signed)
-----   Message from [name], MD sent at 02/09/2015 11:58 AM EDT ----- Schedule appointment with Dr. Armandina Gemma reference patient felt nodule right tail of Spence with negative mammogram and ultrasound. Desires second opinion as to intervention now or observation

## 2015-02-10 NOTE — Telephone Encounter (Signed)
Appointment on 03/01/15 @ 9:15am with Dr.Gerkin, left message for pt to call

## 2015-02-23 DIAGNOSIS — N182 Chronic kidney disease, stage 2 (mild): Secondary | ICD-10-CM | POA: Diagnosis not present

## 2015-02-23 DIAGNOSIS — E780 Pure hypercholesterolemia: Secondary | ICD-10-CM | POA: Diagnosis not present

## 2015-02-23 DIAGNOSIS — I10 Essential (primary) hypertension: Secondary | ICD-10-CM | POA: Diagnosis not present

## 2015-02-23 DIAGNOSIS — R05 Cough: Secondary | ICD-10-CM | POA: Diagnosis not present

## 2015-03-07 DIAGNOSIS — N6021 Fibroadenosis of right breast: Secondary | ICD-10-CM | POA: Diagnosis not present

## 2015-03-30 DIAGNOSIS — C44712 Basal cell carcinoma of skin of right lower limb, including hip: Secondary | ICD-10-CM | POA: Diagnosis not present

## 2015-03-30 DIAGNOSIS — L720 Epidermal cyst: Secondary | ICD-10-CM | POA: Diagnosis not present

## 2015-03-30 DIAGNOSIS — D485 Neoplasm of uncertain behavior of skin: Secondary | ICD-10-CM | POA: Diagnosis not present

## 2015-03-30 DIAGNOSIS — Z85828 Personal history of other malignant neoplasm of skin: Secondary | ICD-10-CM | POA: Diagnosis not present

## 2015-03-30 DIAGNOSIS — Z87898 Personal history of other specified conditions: Secondary | ICD-10-CM | POA: Diagnosis not present

## 2015-03-30 DIAGNOSIS — D225 Melanocytic nevi of trunk: Secondary | ICD-10-CM | POA: Diagnosis not present

## 2015-03-30 DIAGNOSIS — L821 Other seborrheic keratosis: Secondary | ICD-10-CM | POA: Diagnosis not present

## 2015-05-21 ENCOUNTER — Other Ambulatory Visit: Payer: Self-pay | Admitting: Cardiology

## 2015-05-23 DIAGNOSIS — Z23 Encounter for immunization: Secondary | ICD-10-CM | POA: Diagnosis not present

## 2015-05-23 NOTE — Telephone Encounter (Signed)
Should obtain further refills from PCP is not following up with Dr Marlou Porch. Thank you

## 2015-05-23 NOTE — Telephone Encounter (Signed)
Patient is prn follow up. Ok to refill or defer to pcp? Please advise. Thanks, MI

## 2015-06-08 DIAGNOSIS — C44712 Basal cell carcinoma of skin of right lower limb, including hip: Secondary | ICD-10-CM | POA: Diagnosis not present

## 2015-07-21 ENCOUNTER — Other Ambulatory Visit: Payer: Self-pay | Admitting: Cardiology

## 2015-07-27 DIAGNOSIS — Z1231 Encounter for screening mammogram for malignant neoplasm of breast: Secondary | ICD-10-CM | POA: Diagnosis not present

## 2015-07-28 ENCOUNTER — Encounter: Payer: Self-pay | Admitting: Gynecology

## 2015-08-22 DIAGNOSIS — I10 Essential (primary) hypertension: Secondary | ICD-10-CM | POA: Diagnosis not present

## 2015-08-22 DIAGNOSIS — M8580 Other specified disorders of bone density and structure, unspecified site: Secondary | ICD-10-CM | POA: Diagnosis not present

## 2015-08-22 DIAGNOSIS — R7309 Other abnormal glucose: Secondary | ICD-10-CM | POA: Diagnosis not present

## 2015-08-22 DIAGNOSIS — Z1389 Encounter for screening for other disorder: Secondary | ICD-10-CM | POA: Diagnosis not present

## 2015-08-22 DIAGNOSIS — N183 Chronic kidney disease, stage 3 (moderate): Secondary | ICD-10-CM | POA: Diagnosis not present

## 2015-08-22 DIAGNOSIS — K589 Irritable bowel syndrome without diarrhea: Secondary | ICD-10-CM | POA: Diagnosis not present

## 2015-08-22 DIAGNOSIS — E7800 Pure hypercholesterolemia, unspecified: Secondary | ICD-10-CM | POA: Diagnosis not present

## 2015-08-22 DIAGNOSIS — Z0000 Encounter for general adult medical examination without abnormal findings: Secondary | ICD-10-CM | POA: Diagnosis not present

## 2016-01-18 ENCOUNTER — Telehealth: Payer: Self-pay | Admitting: *Deleted

## 2016-01-18 ENCOUNTER — Encounter: Payer: Self-pay | Admitting: Gynecology

## 2016-01-18 ENCOUNTER — Ambulatory Visit (INDEPENDENT_AMBULATORY_CARE_PROVIDER_SITE_OTHER): Payer: Medicare Other | Admitting: Gynecology

## 2016-01-18 VITALS — BP 120/74 | Ht 65.0 in | Wt 121.0 lb

## 2016-01-18 DIAGNOSIS — N63 Unspecified lump in unspecified breast: Secondary | ICD-10-CM

## 2016-01-18 DIAGNOSIS — N952 Postmenopausal atrophic vaginitis: Secondary | ICD-10-CM | POA: Diagnosis not present

## 2016-01-18 DIAGNOSIS — D251 Intramural leiomyoma of uterus: Secondary | ICD-10-CM | POA: Diagnosis not present

## 2016-01-18 DIAGNOSIS — M899 Disorder of bone, unspecified: Secondary | ICD-10-CM | POA: Diagnosis not present

## 2016-01-18 DIAGNOSIS — Z01419 Encounter for gynecological examination (general) (routine) without abnormal findings: Secondary | ICD-10-CM | POA: Diagnosis not present

## 2016-01-18 DIAGNOSIS — M8580 Other specified disorders of bone density and structure, unspecified site: Secondary | ICD-10-CM | POA: Diagnosis not present

## 2016-01-18 MED ORDER — NONFORMULARY OR COMPOUNDED ITEM: Status: DC

## 2016-01-18 NOTE — Progress Notes (Signed)
    Vanessa Shaffer 1941-08-07 [id number]        75 y.o.  G2P2002  for breast and pelvic exam. Several issues noted below  Past medical history,surgical history, problem list, medications, allergies, family history and social history were all reviewed and documented as reviewed in the EPIC chart.  ROS:  Performed with pertinent positives and negatives included in the history, assessment and plan.   Additional significant findings :  none   Exam: Vanessa Shaffer assistant Filed Vitals:   01/18/16 0940  BP: 120/74  Height: 5\' 5"  (1.651 m)  Weight: 121 lb (54.885 kg)   General appearance:  Normal affect, orientation and appearance. Skin: Grossly normal HEENT: Without gross lesions.  No cervical or supraclavicular adenopathy. Thyroid normal.  Lungs:  Clear without wheezing, rales or rhonchi Cardiac: RR, without RMG Abdominal:  Soft, nontender, without masses, guarding, rebound, organomegaly or hernia Breasts:  Examined lying and sitting. Left without masses, retractions, discharge or axillary adenopathy.  Right with small nodularity tail of Spence with no overlying skin changes, nipple discharge, axillary adenopathy. Pelvic:  Ext/BUS/vagina with atrophic changes  Cervix with atrophic changes  Uterus anteverted, normal size, shape and contour, midline and mobile nontender   Adnexa without masses or tenderness    Anus and perineum normal   Rectovaginal normal sphincter tone without palpated masses or tenderness.    Assessment/Plan:  75 y.o. [id number] female for breast and pelvic exam.   1. Persistent right tail of Spence nodularity. Workup last year included a negative ultrasound and negative mammogram. Saw Dr. Harlow Asa in follow up who believes this is a benign process and recommended expectant management. Patient notes on self-exam this area has not changed at all. It appears to be stable on my exam. I reviewed the issues of continued observation versus excision. She is comfortable with  observation and will follow for now and report any changes. Her most recent mammogram in November was negative. She'll continue with annual mammography. 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 Is doing well with this and wants to continue. I reviewed the risks of absorption with systemic effects to include thrombosis such as stroke heart attack DVT, breast cancer and endometrial stimulation. Patients comfortable continuing and I refilled her 1 year.  Patient knows to call with any vaginal bleeding. She is not having other menopausal symptoms such as hot flashes or night sweats. 3. Osteopenia.  DEXA 2015 T score -2.3 stable from prior DEXA. FRAX 13%/3.4%. We discussed previously her 10 year fracture risk of the hip exceeding 3% can options for treatment. Patient declined treatment at that time. Had a borderline vitamin D level and is taking extra vitamin D now. Will check vitamin D level now. Will repeat DEXA this coming summer at two-year interval and then we will go from there. 4. History of leiomyoma. Exam uterus palpates normal size. Have been generous in the past. Continue with annual exam monitoring. 5. Pap smear 2015. No Pap smear done today. No history of significant abnormal Pap smears. Options to stop screening altogether or less frequent screening intervals reviewed. Will readdress on annual basis. 6. Colonoscopy 2014. Repeat at their recommended interval. 7. Health maintenance. No routine lab work done as this is done at her primary physician's office. Follow up 1 year, sooner as needed.     Vanessa Auerbach MD, 10:02 AM 01/18/2016

## 2016-01-18 NOTE — Telephone Encounter (Signed)
-----   Message from [name], MD sent at 01/18/2016 10:08 AM EDT ----- Call in refill for vaginal estradiol cream [facility]. Twice weekly application. Refill 1 year

## 2016-01-18 NOTE — Patient Instructions (Signed)
Follow up for bone density this coming summer as scheduled  You may obtain a copy of any labs that were done today by logging onto MyChart as outlined in the instructions provided with your AVS (after visit summary). The office will not call with normal lab results but certainly if there are any significant abnormalities then we will contact you.   Health Maintenance Adopting a healthy lifestyle and getting preventive care can go a long way to promote health and wellness. Talk with your health care provider about what schedule of regular examinations is right for you. This is a good chance for you to check in with your provider about disease prevention and staying healthy. In between checkups, there are plenty of things you can do on your own. Experts have done a lot of research about which lifestyle changes and preventive measures are most likely to keep you healthy. Ask your health care provider for more information. WEIGHT AND DIET  Eat a healthy diet  Be sure to include plenty of vegetables, fruits, low-fat dairy products, and lean protein.  Do not eat a lot of foods high in solid fats, added sugars, or salt.  Get regular exercise. This is one of the most important things you can do for your health.  Most adults should exercise for at least 150 minutes each week. The exercise should increase your heart rate and make you sweat (moderate-intensity exercise).  Most adults should also do strengthening exercises at least twice a week. This is in addition to the moderate-intensity exercise.  Maintain a healthy weight  Body mass index (BMI) is a measurement that can be used to identify possible weight problems. It estimates body fat based on height and weight. Your health care provider can help determine your BMI and help you achieve or maintain a healthy weight.  For females 45 years of age and older:   A BMI below 18.5 is considered underweight.  A BMI of 18.5 to 24.9 is normal.  A BMI  of 25 to 29.9 is considered overweight.  A BMI of 30 and above is considered obese.  Watch levels of cholesterol and blood lipids  You should start having your blood tested for lipids and cholesterol at 75 years of age, then have this test every 5 years.  You may need to have your cholesterol levels checked more often if:  Your lipid or cholesterol levels are high.  You are older than 75 years of age.  You are at high risk for heart disease.  CANCER SCREENING   Lung Cancer  Lung cancer screening is recommended for adults 68-21 years old who are at high risk for lung cancer because of a history of smoking.  A yearly low-dose CT scan of the lungs is recommended for people who:  Currently smoke.  Have quit within the past 15 years.  Have at least a 30-pack-year history of smoking. A pack year is smoking an average of one pack of cigarettes a day for 1 year.  Yearly screening should continue until it has been 15 years since you quit.  Yearly screening should stop if you develop a health problem that would prevent you from having lung cancer treatment.  Breast Cancer  Practice breast self-awareness. This means understanding how your breasts normally appear and feel.  It also means doing regular breast self-exams. Let your health care provider know about any changes, no matter how small.  If you are in your 20s or 30s, you should have a  clinical breast exam (CBE) by a health care provider every 1-3 years as part of a regular health exam.  If you are 10 or older, have a CBE every year. Also consider having a breast X-ray (mammogram) every year.  If you have a family history of breast cancer, talk to your health care provider about genetic screening.  If you are at high risk for breast cancer, talk to your health care provider about having an MRI and a mammogram every year.  Breast cancer gene (BRCA) assessment is recommended for women who have family members with  BRCA-related cancers. BRCA-related cancers include:  Breast.  Ovarian.  Tubal.  Peritoneal cancers.  Results of the assessment will determine the need for genetic counseling and BRCA1 and BRCA2 testing. Cervical Cancer Routine pelvic examinations to screen for cervical cancer are no longer recommended for nonpregnant women who are considered low risk for cancer of the pelvic organs (ovaries, uterus, and vagina) and who do not have symptoms. A pelvic examination may be necessary if you have symptoms including those associated with pelvic infections. Ask your health care provider if a screening pelvic exam is right for you.   The Pap test is the screening test for cervical cancer for women who are considered at risk.  If you had a hysterectomy for a problem that was not cancer or a condition that could lead to cancer, then you no longer need Pap tests.  If you are older than 65 years, and you have had normal Pap tests for the past 10 years, you no longer need to have Pap tests.  If you have had past treatment for cervical cancer or a condition that could lead to cancer, you need Pap tests and screening for cancer for at least 20 years after your treatment.  If you no longer get a Pap test, assess your risk factors if they change (such as having a new sexual partner). This can affect whether you should start being screened again.  Some women have medical problems that increase their chance of getting cervical cancer. If this is the case for you, your health care provider may recommend more frequent screening and Pap tests.  The human papillomavirus (HPV) test is another test that may be used for cervical cancer screening. The HPV test looks for the virus that can cause cell changes in the cervix. The cells collected during the Pap test can be tested for HPV.  The HPV test can be used to screen women 11 years of age and older. Getting tested for HPV can extend the interval between normal Pap  tests from three to five years.  An HPV test also should be used to screen women of any age who have unclear Pap test results.  After 75 years of age, women should have HPV testing as often as Pap tests.  Colorectal Cancer  This type of cancer can be detected and often prevented.  Routine colorectal cancer screening usually begins at 75 years of age and continues through 75 years of age.  Your health care provider may recommend screening at an earlier age if you have risk factors for colon cancer.  Your health care provider may also recommend using home test kits to check for hidden blood in the stool.  A small camera at the end of a tube can be used to examine your colon directly (sigmoidoscopy or colonoscopy). This is done to check for the earliest forms of colorectal cancer.  Routine screening usually begins at  age 56.  Direct examination of the colon should be repeated every 5-10 years through 75 years of age. However, you may need to be screened more often if early forms of precancerous polyps or small growths are found. Skin Cancer  Check your skin from head to toe regularly.  Tell your health care provider about any new moles or changes in moles, especially if there is a change in a mole's shape or color.  Also tell your health care provider if you have a mole that is larger than the size of a pencil eraser.  Always use sunscreen. Apply sunscreen liberally and repeatedly throughout the day.  Protect yourself by wearing long sleeves, pants, a wide-brimmed hat, and sunglasses whenever you are outside. HEART DISEASE, DIABETES, AND HIGH BLOOD PRESSURE   Have your blood pressure checked at least every 1-2 years. High blood pressure causes heart disease and increases the risk of stroke.  If you are between 58 years and 16 years old, ask your health care provider if you should take aspirin to prevent strokes.  Have regular diabetes screenings. This involves taking a blood sample  to check your fasting blood sugar level.  If you are at a normal weight and have a low risk for diabetes, have this test once every three years after 75 years of age.  If you are overweight and have a high risk for diabetes, consider being tested at a younger age or more often. PREVENTING INFECTION  Hepatitis B  If you have a higher risk for hepatitis B, you should be screened for this virus. You are considered at high risk for hepatitis B if:  You were born in a country where hepatitis B is common. Ask your health care provider which countries are considered high risk.  Your parents were born in a high-risk country, and you have not been immunized against hepatitis B (hepatitis B vaccine).  You have HIV or AIDS.  You use needles to inject street drugs.  You live with someone who has hepatitis B.  You have had sex with someone who has hepatitis B.  You get hemodialysis treatment.  You take certain medicines for conditions, including cancer, organ transplantation, and autoimmune conditions. Hepatitis C  Blood testing is recommended for:  Everyone born from [birth year] through 1965.  Anyone with known risk factors for hepatitis C. Sexually transmitted infections (STIs)  You should be screened for sexually transmitted infections (STIs) including gonorrhea and chlamydia if:  You are sexually active and are younger than 75 years of age.  You are older than 75 years of age and your health care provider tells you that you are at risk for this type of infection.  Your sexual activity has changed since you were last screened and you are at an increased risk for chlamydia or gonorrhea. Ask your health care provider if you are at risk.  If you do not have HIV, but are at risk, it may be recommended that you take a prescription medicine daily to prevent HIV infection. This is called pre-exposure prophylaxis (PrEP). You are considered at risk if:  You are sexually active and do not regularly  use condoms or know the HIV status of your partner(s).  You take drugs by injection.  You are sexually active with a partner who has HIV. Talk with your health care provider about whether you are at high risk of being infected with HIV. If you choose to begin PrEP, you should first be tested for HIV. You  should then be tested every 3 months for as long as you are taking PrEP.  PREGNANCY   If you are premenopausal and you may become pregnant, ask your health care provider about preconception counseling.  If you may become pregnant, take 400 to 800 micrograms (mcg) of folic acid every day.  If you want to prevent pregnancy, talk to your health care provider about birth control (contraception). OSTEOPOROSIS AND MENOPAUSE   Osteoporosis is a disease in which the bones lose minerals and strength with aging. This can result in serious bone fractures. Your risk for osteoporosis can be identified using a bone density scan.  If you are 65 years of age or older, or if you are at risk for osteoporosis and fractures, ask your health care provider if you should be screened.  Ask your health care provider whether you should take a calcium or vitamin D supplement to lower your risk for osteoporosis.  Menopause may have certain physical symptoms and risks.  Hormone replacement therapy may reduce some of these symptoms and risks. Talk to your health care provider about whether hormone replacement therapy is right for you.  HOME CARE INSTRUCTIONS   Schedule regular health, dental, and eye exams.  Stay current with your immunizations.   Do not use any tobacco products including cigarettes, chewing tobacco, or electronic cigarettes.  If you are pregnant, do not drink alcohol.  If you are breastfeeding, limit how much and how often you drink alcohol.  Limit alcohol intake to no more than 1 drink per day for nonpregnant women. One drink equals 12 ounces of beer, 5 ounces of wine, or 1 ounces of hard  liquor.  Do not use street drugs.  Do not share needles.  Ask your health care provider for help if you need support or information about quitting drugs.  Tell your health care provider if you often feel depressed.  Tell your health care provider if you have ever been abused or do not feel safe at home. Document Released: 03/12/2011 Document Revised: 01/11/2014 Document Reviewed: 07/29/2013 ExitCare Patient Information 2015 [employer]. This information is not intended to replace advice given to you by your health care provider. Make sure you discuss any questions you have with your health care provider.  

## 2016-01-18 NOTE — Telephone Encounter (Signed)
Rx called in 

## 2016-01-19 LAB — VITAMIN D 25 HYDROXY (VIT D DEFICIENCY, FRACTURES): VIT D 25 HYDROXY: 35 ng/mL (ref 30–100)

## 2016-02-22 DIAGNOSIS — H5213 Myopia, bilateral: Secondary | ICD-10-CM | POA: Diagnosis not present

## 2016-02-22 DIAGNOSIS — H2513 Age-related nuclear cataract, bilateral: Secondary | ICD-10-CM | POA: Diagnosis not present

## 2016-02-22 DIAGNOSIS — I10 Essential (primary) hypertension: Secondary | ICD-10-CM | POA: Diagnosis not present

## 2016-02-22 DIAGNOSIS — H35033 Hypertensive retinopathy, bilateral: Secondary | ICD-10-CM | POA: Diagnosis not present

## 2016-02-22 DIAGNOSIS — H52223 Regular astigmatism, bilateral: Secondary | ICD-10-CM | POA: Diagnosis not present

## 2016-02-25 IMAGING — CR DG ABDOMEN 1V
1 series · 1 of 1 positions shown · non-contrast
Comparison: No prior.

CLINICAL DATA: Constipation.

EXAM:
ABDOMEN - 1 VIEW

[t abdomen supine]
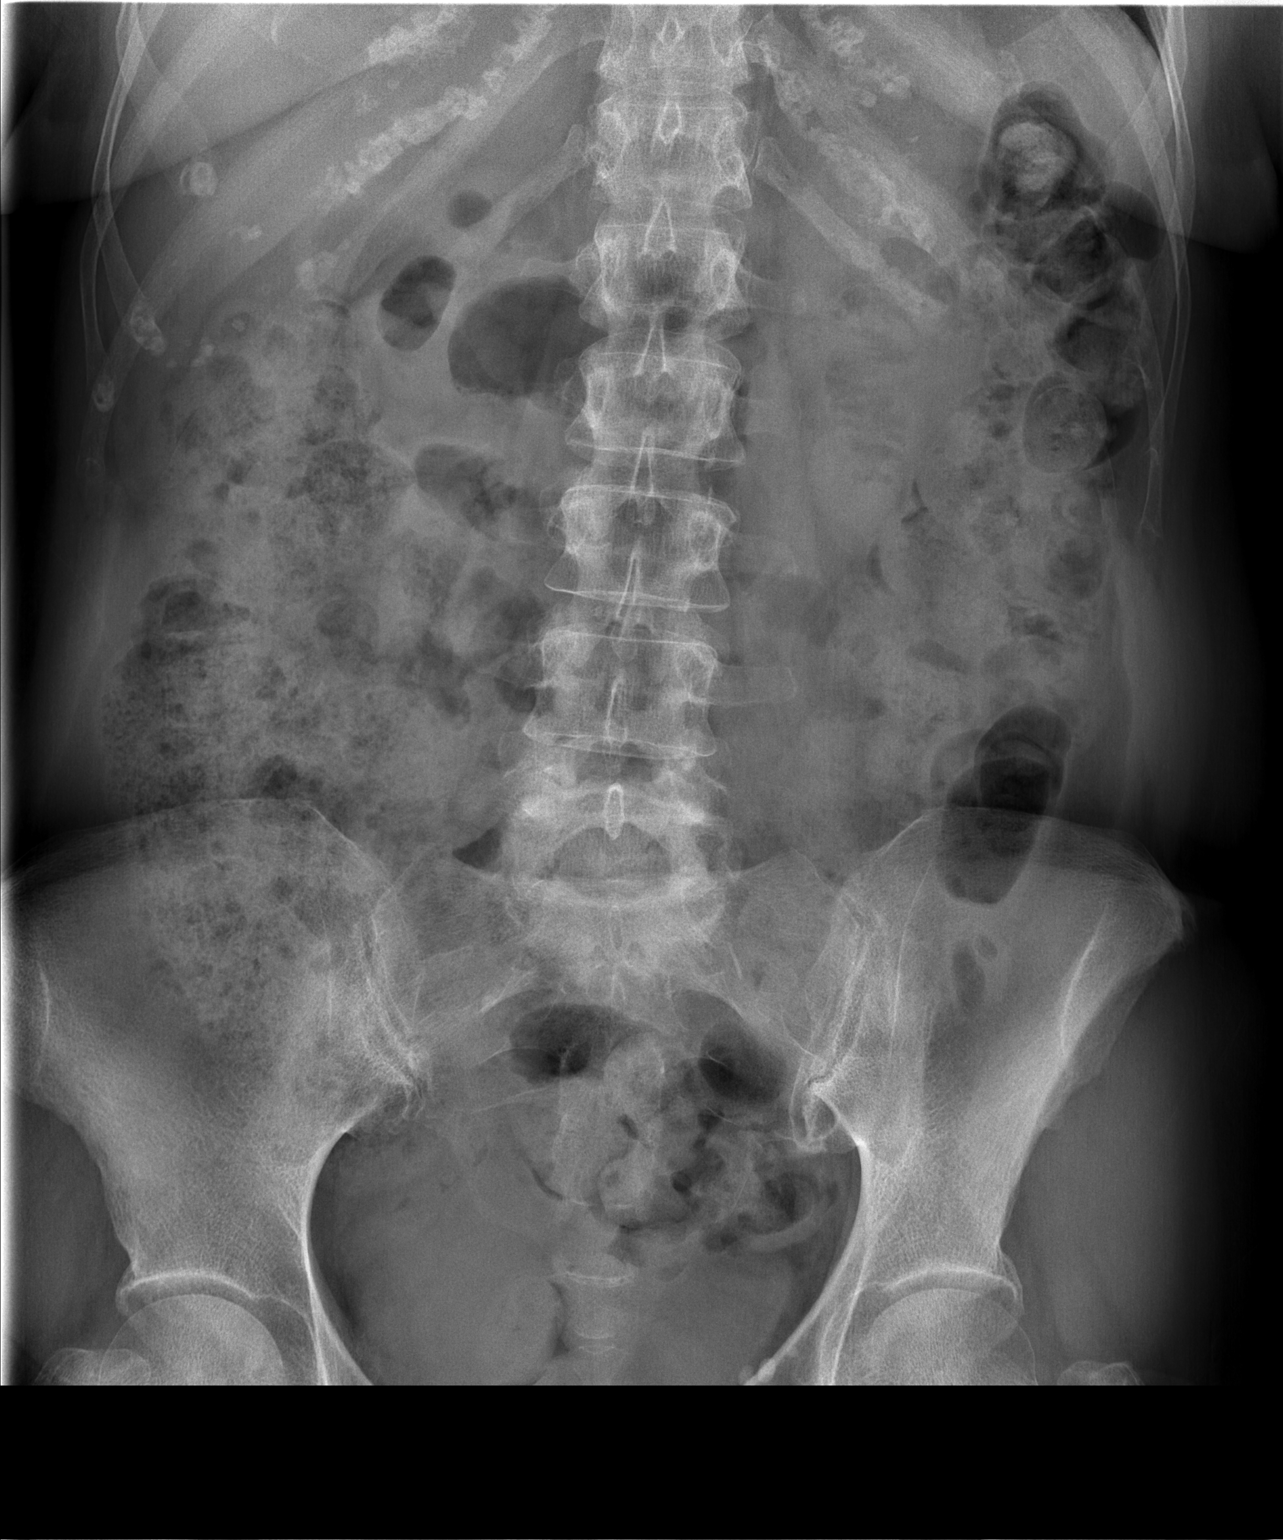

[1 of 1 positions shown; findings below may reference images not displayed]

FINDINGS: Soft tissue structures are unremarkable. Large amount of stool is
noted throughout the colon. Constipation could present in this
fashion. Calcifications present consistent with phleboliths. No
acute bony abnormality.
IMPRESSION: Large amount of stool noted within the colon suggesting
constipation.

## 2016-03-28 DIAGNOSIS — L821 Other seborrheic keratosis: Secondary | ICD-10-CM | POA: Diagnosis not present

## 2016-03-28 DIAGNOSIS — D225 Melanocytic nevi of trunk: Secondary | ICD-10-CM | POA: Diagnosis not present

## 2016-03-28 DIAGNOSIS — Z85828 Personal history of other malignant neoplasm of skin: Secondary | ICD-10-CM | POA: Diagnosis not present

## 2016-03-28 DIAGNOSIS — Z87898 Personal history of other specified conditions: Secondary | ICD-10-CM | POA: Diagnosis not present

## 2016-03-28 DIAGNOSIS — L720 Epidermal cyst: Secondary | ICD-10-CM | POA: Diagnosis not present

## 2016-04-04 IMAGING — CR DG CHEST 2V
2 series · 2 of 2 positions shown · non-contrast
Comparison: None.

CLINICAL DATA: Shortness of breath

EXAM:
CHEST  2 VIEW

[view not recorded (1 of 2)]
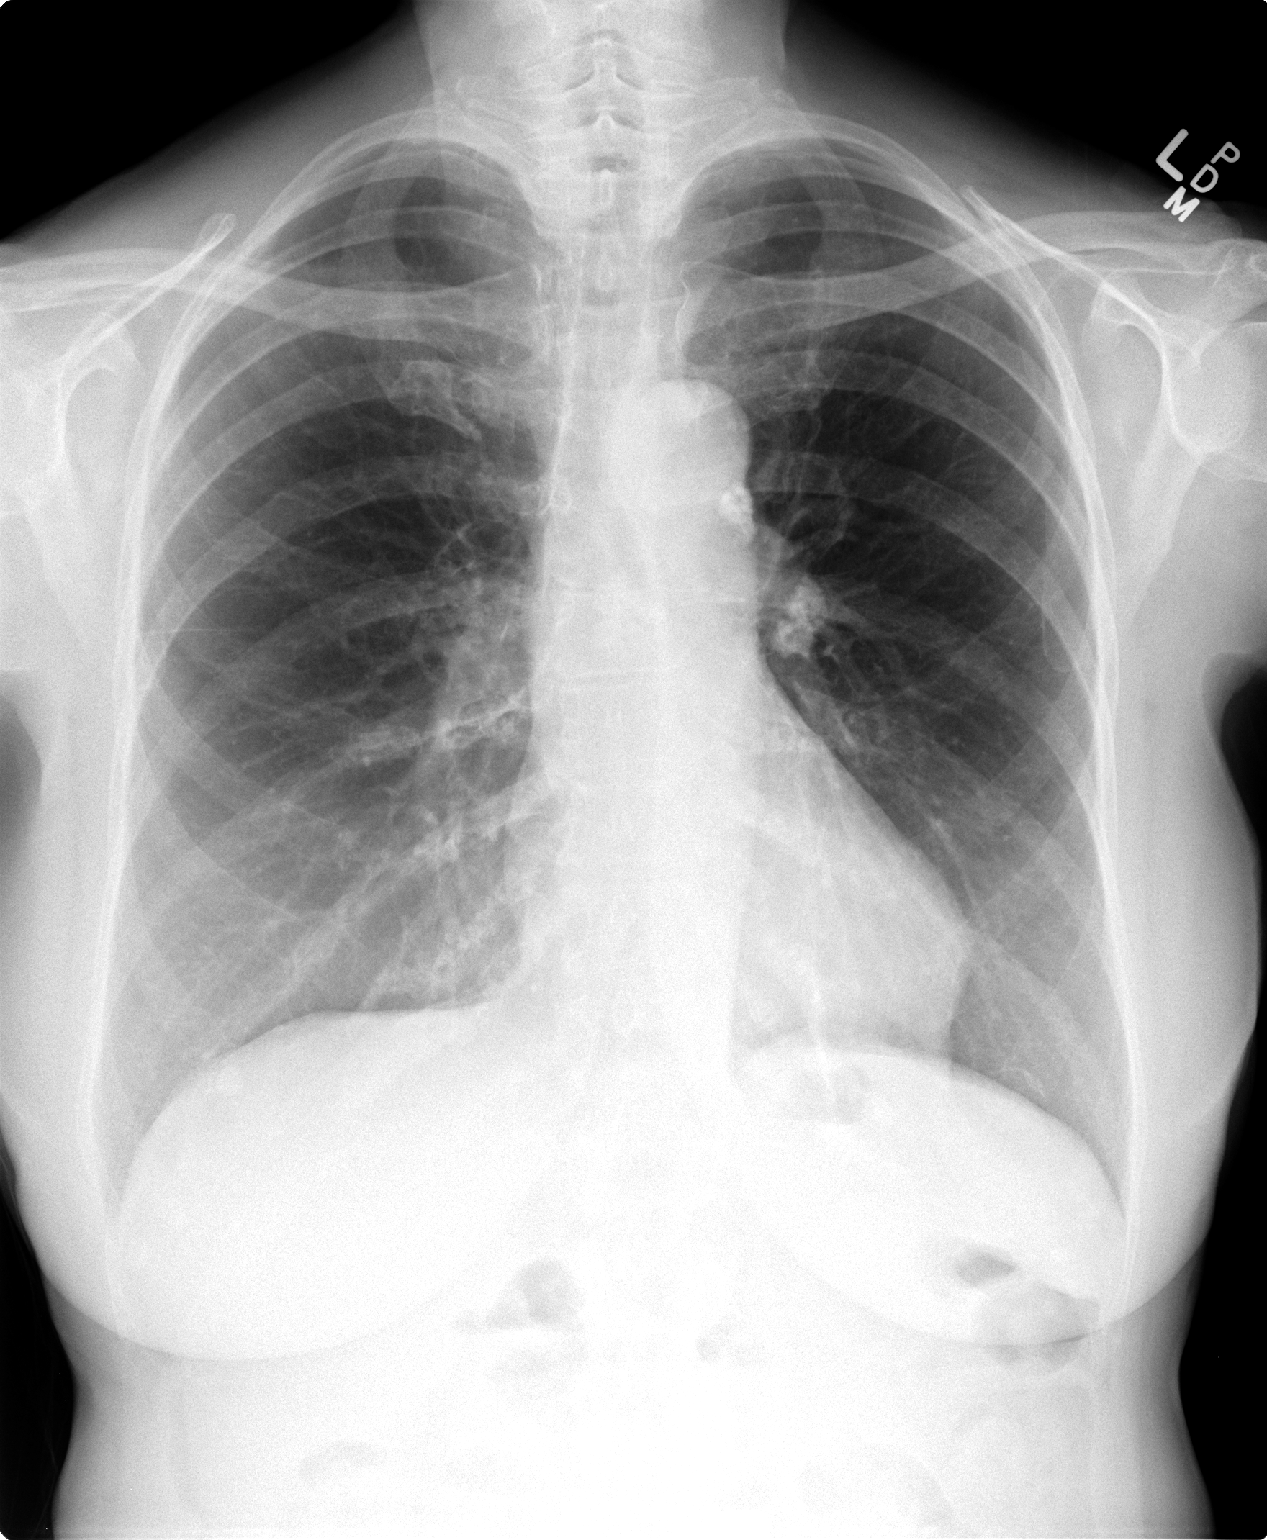

[view not recorded (2 of 2)]
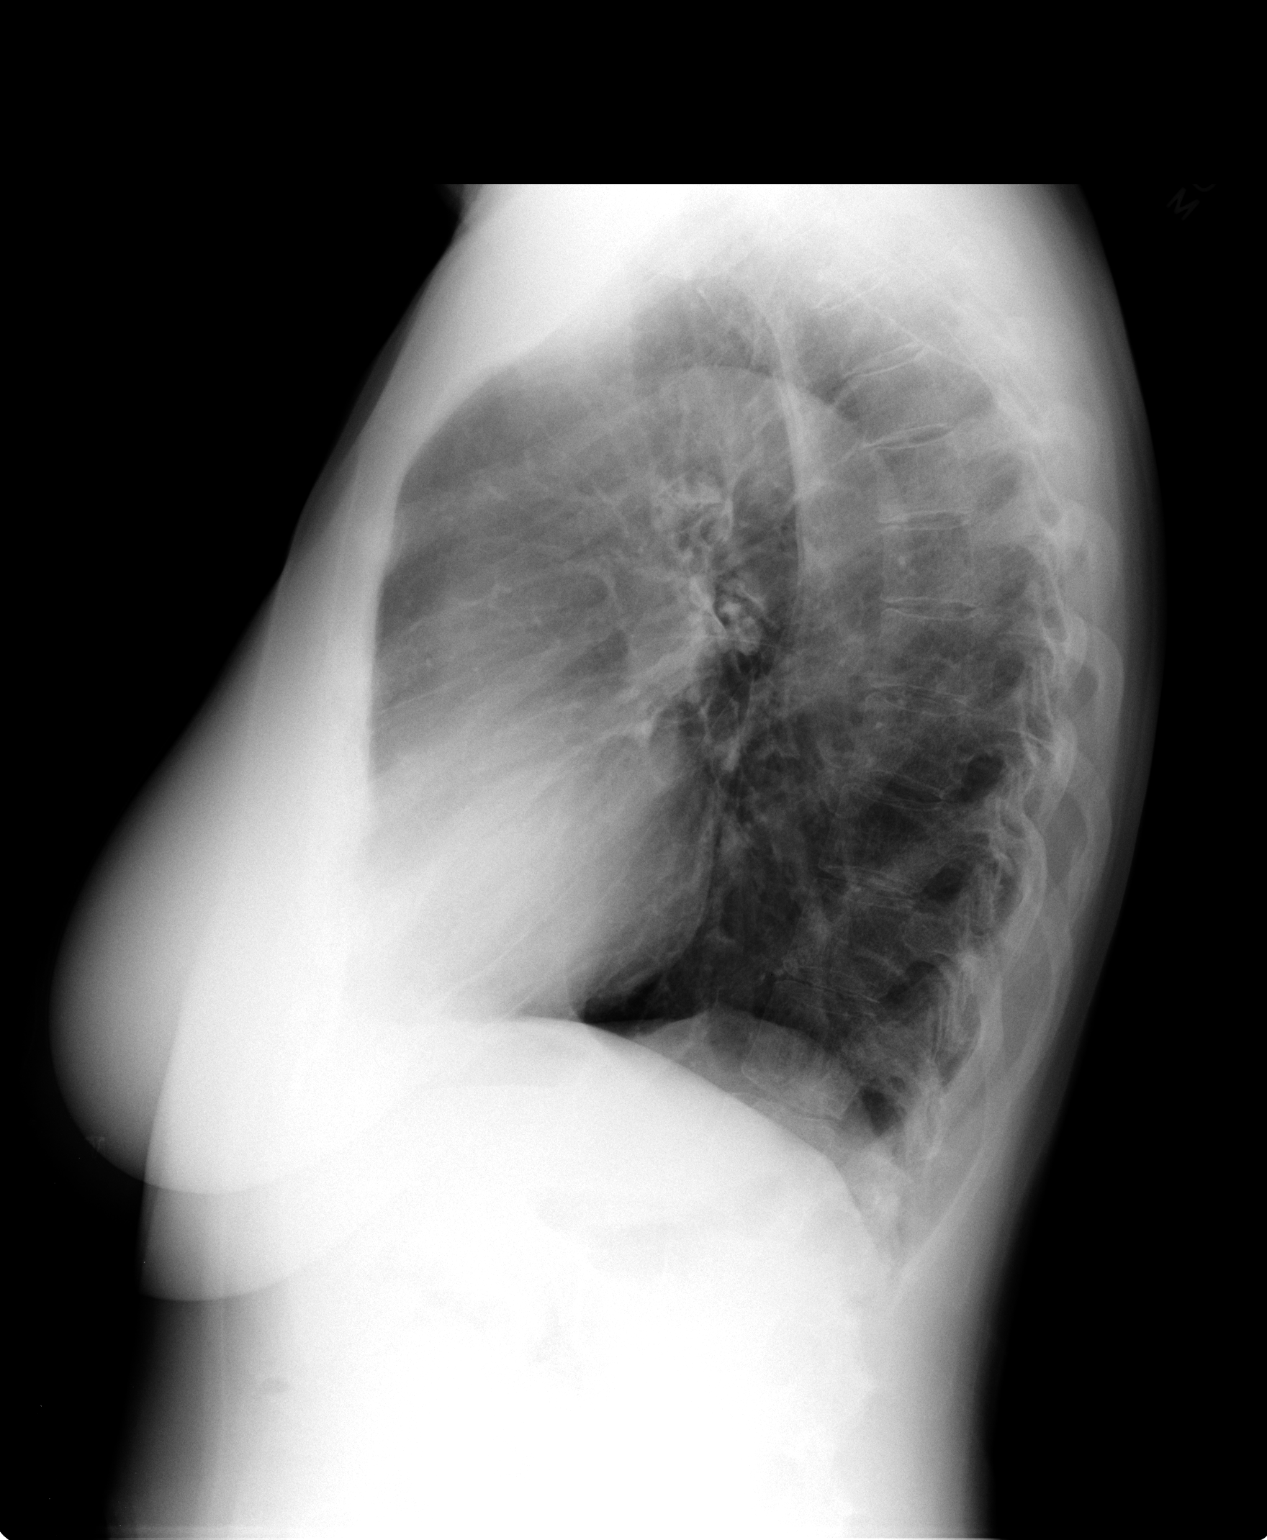

[2 of 2 positions shown; findings below may reference images not displayed]

FINDINGS: The heart size and mediastinal contours are within normal limits.
Both lungs are clear. The visualized skeletal structures are
unremarkable.
IMPRESSION: No active cardiopulmonary disease.

## 2016-05-23 DIAGNOSIS — Z23 Encounter for immunization: Secondary | ICD-10-CM | POA: Diagnosis not present

## 2016-07-03 ENCOUNTER — Encounter: Payer: Self-pay | Admitting: Gynecology

## 2016-07-03 ENCOUNTER — Telehealth: Payer: Self-pay | Admitting: Gynecology

## 2016-07-03 ENCOUNTER — Ambulatory Visit (INDEPENDENT_AMBULATORY_CARE_PROVIDER_SITE_OTHER): Payer: Medicare Other

## 2016-07-03 ENCOUNTER — Other Ambulatory Visit: Payer: Self-pay | Admitting: Gynecology

## 2016-07-03 DIAGNOSIS — Z780 Asymptomatic menopausal state: Secondary | ICD-10-CM

## 2016-07-03 DIAGNOSIS — M8580 Other specified disorders of bone density and structure, unspecified site: Secondary | ICD-10-CM | POA: Diagnosis not present

## 2016-07-03 NOTE — Telephone Encounter (Signed)
Tell patient her most recent bone density was stable from her prior [place] without a statistically significant loss of bone. It still shows a higher fracture risk at the hip as calculated. We discussed this in the past and discuss treatment options. She declined treatment in the past. If she is interested in reconsidering then office visit to discuss otherwise will repeat bone density in 2 years

## 2016-07-04 NOTE — Telephone Encounter (Signed)
Pt informed with the below note. 

## 2016-08-08 ENCOUNTER — Encounter: Payer: Self-pay | Admitting: Gynecology

## 2016-08-08 DIAGNOSIS — Z1231 Encounter for screening mammogram for malignant neoplasm of breast: Secondary | ICD-10-CM | POA: Diagnosis not present

## 2016-08-22 DIAGNOSIS — E7800 Pure hypercholesterolemia, unspecified: Secondary | ICD-10-CM | POA: Diagnosis not present

## 2016-08-22 DIAGNOSIS — M8580 Other specified disorders of bone density and structure, unspecified site: Secondary | ICD-10-CM | POA: Diagnosis not present

## 2016-08-22 DIAGNOSIS — Z0000 Encounter for general adult medical examination without abnormal findings: Secondary | ICD-10-CM | POA: Diagnosis not present

## 2016-08-22 DIAGNOSIS — I10 Essential (primary) hypertension: Secondary | ICD-10-CM | POA: Diagnosis not present

## 2016-08-22 DIAGNOSIS — N183 Chronic kidney disease, stage 3 (moderate): Secondary | ICD-10-CM | POA: Diagnosis not present

## 2016-08-22 DIAGNOSIS — Z1389 Encounter for screening for other disorder: Secondary | ICD-10-CM | POA: Diagnosis not present

## 2016-08-22 DIAGNOSIS — E559 Vitamin D deficiency, unspecified: Secondary | ICD-10-CM | POA: Diagnosis not present

## 2016-08-22 DIAGNOSIS — R7303 Prediabetes: Secondary | ICD-10-CM | POA: Diagnosis not present

## 2016-11-13 DIAGNOSIS — R109 Unspecified abdominal pain: Secondary | ICD-10-CM | POA: Diagnosis not present

## 2016-11-13 DIAGNOSIS — K5900 Constipation, unspecified: Secondary | ICD-10-CM | POA: Diagnosis not present

## 2016-12-31 DIAGNOSIS — J069 Acute upper respiratory infection, unspecified: Secondary | ICD-10-CM | POA: Diagnosis not present

## 2017-01-07 DIAGNOSIS — R062 Wheezing: Secondary | ICD-10-CM | POA: Diagnosis not present

## 2017-01-07 DIAGNOSIS — R05 Cough: Secondary | ICD-10-CM | POA: Diagnosis not present

## 2017-02-13 ENCOUNTER — Ambulatory Visit (INDEPENDENT_AMBULATORY_CARE_PROVIDER_SITE_OTHER): Payer: Medicare Other | Admitting: Gynecology

## 2017-02-13 ENCOUNTER — Encounter: Payer: Self-pay | Admitting: Gynecology

## 2017-02-13 VITALS — BP 118/74 | Ht 65.0 in | Wt 119.0 lb

## 2017-02-13 DIAGNOSIS — Z01411 Encounter for gynecological examination (general) (routine) with abnormal findings: Secondary | ICD-10-CM

## 2017-02-13 DIAGNOSIS — N952 Postmenopausal atrophic vaginitis: Secondary | ICD-10-CM

## 2017-02-13 DIAGNOSIS — Z124 Encounter for screening for malignant neoplasm of cervix: Secondary | ICD-10-CM | POA: Diagnosis not present

## 2017-02-13 DIAGNOSIS — M8580 Other specified disorders of bone density and structure, unspecified site: Secondary | ICD-10-CM

## 2017-02-13 NOTE — Progress Notes (Signed)
    Vanessa Shaffer [birth year] [id number]        76 y.o.  G2P2002 for breast and pelvic exam.  Past medical history,surgical history, problem list, medications, allergies, family history and social history were all reviewed and documented as reviewed in the EPIC chart.  ROS:  Performed with pertinent positives and negatives included in the history, assessment and plan.   Additional significant findings :  None   Exam: Vanessa Shaffer assistant Vitals:   02/13/17 0919  BP: 118/74  Weight: 119 lb (54 kg)  Height: 5\' 5"  (1.651 m)   Body mass index is 19.8 kg/m.  General appearance:  Normal affect, orientation and appearance. Skin: Grossly normal HEENT: Without gross lesions.  No cervical or supraclavicular adenopathy. Thyroid normal.  Lungs:  Clear without wheezing, rales or rhonchi Cardiac: RR, without RMG Abdominal:  Soft, nontender, without masses, guarding, rebound, organomegaly or hernia Breasts:  Examined lying and sitting without masses, retractions, discharge or axillary adenopathy. Pelvic:  Ext, BUS, Vagina: With atrophic changes  Cervix: With atrophic changes  Uterus: Anteverted, normal size, shape and contour, midline and mobile nontender   Adnexa: Without masses or tenderness    Anus and perineum: Normal   Rectovaginal: Normal sphincter tone without palpated masses or tenderness.    Assessment/Plan:  76 y.o. [id number] female for breast and pelvic exam.   1. Postmenopausal/atrophic genital changes. Is using formulated estradiol vaginal cream twice weekly with good results. Has supply at home but will call when she needs more. We have discussed the issues of absorption with systemic effects to include thrombosis such as stroke heart attack DVT, breast stimulation, endometrial stimulation. Patient's comfortable continuing. No vaginal bleeding. Patient knows report any vaginal bleeding. 2. Mammography 07/2016. Continues to have nodularity in the right tail of Spence. Has  been stable over years observation. Saw Dr. Harlow Asa who recommended observation. Has not changed to her self-exam and is unchanged to my exam. Options to see Dr.Gerkin again now with options to excise versus continue to follow discussed. Patient's comfortable with ongoing expectant management with suppressed exams. Is due for her mammogram this coming fall. I asked her to call before having this done and I will put an order in to read look at this area with ultrasound as this has not been done recently. 3. Osteopenia.  DEXA 06/2016. T score -2.1 stable from prior DEXA. FRAX 17%/7.5%. We have discussed her increased hip FRAX calculation and options for treatment. The patient has declined treatment but prefers observation with follow up studies. She is active with weightbearing exercise. Plan repeat DEXA next year at 2 year interval. Increased calcium vitamin D. 4. History of leiomyoma. Exam is overall normal. Continue with annual exam follow up. 5. Pap smear 2015. Pap smear done today. No history of abnormal Pap smears previously. Options to stop screening per current screening guidelines reviewed. 6. Colonoscopy 2014. Repeat at their recommended interval. 7. Health maintenance. No routine lab work done as this is done at her primary physician's office. Follow up 1 year, sooner as needed.   Vanessa Auerbach MD, 10:00 AM 02/13/2017

## 2017-02-13 NOTE — Addendum Note (Signed)
Addended by: Nelva Nay on: 02/13/2017 10:17 AM   Modules accepted: Orders

## 2017-02-13 NOTE — Patient Instructions (Signed)
Annual exam in one year 

## 2017-02-14 LAB — PAP IG (IMAGE GUIDED)

## 2017-06-01 DIAGNOSIS — Z23 Encounter for immunization: Secondary | ICD-10-CM | POA: Diagnosis not present

## 2017-08-14 ENCOUNTER — Encounter: Payer: Self-pay | Admitting: Gynecology

## 2017-08-14 DIAGNOSIS — R928 Other abnormal and inconclusive findings on diagnostic imaging of breast: Secondary | ICD-10-CM | POA: Diagnosis not present

## 2017-09-18 DIAGNOSIS — N183 Chronic kidney disease, stage 3 (moderate): Secondary | ICD-10-CM | POA: Diagnosis not present

## 2017-09-18 DIAGNOSIS — M8580 Other specified disorders of bone density and structure, unspecified site: Secondary | ICD-10-CM | POA: Diagnosis not present

## 2017-09-18 DIAGNOSIS — I10 Essential (primary) hypertension: Secondary | ICD-10-CM | POA: Diagnosis not present

## 2017-09-18 DIAGNOSIS — R739 Hyperglycemia, unspecified: Secondary | ICD-10-CM | POA: Diagnosis not present

## 2017-09-18 DIAGNOSIS — E7800 Pure hypercholesterolemia, unspecified: Secondary | ICD-10-CM | POA: Diagnosis not present

## 2017-09-18 DIAGNOSIS — Z1389 Encounter for screening for other disorder: Secondary | ICD-10-CM | POA: Diagnosis not present

## 2017-09-18 DIAGNOSIS — Z0000 Encounter for general adult medical examination without abnormal findings: Secondary | ICD-10-CM | POA: Diagnosis not present

## 2017-09-18 DIAGNOSIS — G4700 Insomnia, unspecified: Secondary | ICD-10-CM | POA: Diagnosis not present

## 2017-11-20 DIAGNOSIS — H52223 Regular astigmatism, bilateral: Secondary | ICD-10-CM | POA: Diagnosis not present

## 2017-11-20 DIAGNOSIS — H5213 Myopia, bilateral: Secondary | ICD-10-CM | POA: Diagnosis not present

## 2017-11-20 DIAGNOSIS — H2513 Age-related nuclear cataract, bilateral: Secondary | ICD-10-CM | POA: Diagnosis not present

## 2018-01-08 DIAGNOSIS — L739 Follicular disorder, unspecified: Secondary | ICD-10-CM | POA: Diagnosis not present

## 2018-01-08 DIAGNOSIS — L821 Other seborrheic keratosis: Secondary | ICD-10-CM | POA: Diagnosis not present

## 2018-01-08 DIAGNOSIS — Z87898 Personal history of other specified conditions: Secondary | ICD-10-CM | POA: Diagnosis not present

## 2018-01-08 DIAGNOSIS — D485 Neoplasm of uncertain behavior of skin: Secondary | ICD-10-CM | POA: Diagnosis not present

## 2018-01-08 DIAGNOSIS — Z85828 Personal history of other malignant neoplasm of skin: Secondary | ICD-10-CM | POA: Diagnosis not present

## 2018-01-08 DIAGNOSIS — L814 Other melanin hyperpigmentation: Secondary | ICD-10-CM | POA: Diagnosis not present

## 2018-01-08 DIAGNOSIS — L720 Epidermal cyst: Secondary | ICD-10-CM | POA: Diagnosis not present

## 2018-01-08 DIAGNOSIS — D1801 Hemangioma of skin and subcutaneous tissue: Secondary | ICD-10-CM | POA: Diagnosis not present

## 2018-01-08 DIAGNOSIS — L570 Actinic keratosis: Secondary | ICD-10-CM | POA: Diagnosis not present

## 2018-01-08 DIAGNOSIS — C44519 Basal cell carcinoma of skin of other part of trunk: Secondary | ICD-10-CM | POA: Diagnosis not present

## 2018-01-08 DIAGNOSIS — D225 Melanocytic nevi of trunk: Secondary | ICD-10-CM | POA: Diagnosis not present

## 2018-01-09 DIAGNOSIS — C44519 Basal cell carcinoma of skin of other part of trunk: Secondary | ICD-10-CM | POA: Diagnosis not present

## 2018-01-09 DIAGNOSIS — D485 Neoplasm of uncertain behavior of skin: Secondary | ICD-10-CM | POA: Diagnosis not present

## 2018-01-29 DIAGNOSIS — L700 Acne vulgaris: Secondary | ICD-10-CM | POA: Diagnosis not present

## 2018-01-29 DIAGNOSIS — C44519 Basal cell carcinoma of skin of other part of trunk: Secondary | ICD-10-CM | POA: Diagnosis not present

## 2018-04-01 DIAGNOSIS — Z411 Encounter for cosmetic surgery: Secondary | ICD-10-CM | POA: Diagnosis not present

## 2018-04-01 DIAGNOSIS — L910 Hypertrophic scar: Secondary | ICD-10-CM | POA: Diagnosis not present

## 2018-04-02 ENCOUNTER — Ambulatory Visit (INDEPENDENT_AMBULATORY_CARE_PROVIDER_SITE_OTHER): Payer: Medicare Other | Admitting: Gynecology

## 2018-04-02 ENCOUNTER — Telehealth: Payer: Self-pay | Admitting: *Deleted

## 2018-04-02 ENCOUNTER — Encounter: Payer: Self-pay | Admitting: Gynecology

## 2018-04-02 VITALS — BP 118/74 | Ht 65.0 in | Wt 120.0 lb

## 2018-04-02 DIAGNOSIS — M8580 Other specified disorders of bone density and structure, unspecified site: Secondary | ICD-10-CM

## 2018-04-02 DIAGNOSIS — N952 Postmenopausal atrophic vaginitis: Secondary | ICD-10-CM

## 2018-04-02 DIAGNOSIS — Z01419 Encounter for gynecological examination (general) (routine) without abnormal findings: Secondary | ICD-10-CM | POA: Diagnosis not present

## 2018-04-02 DIAGNOSIS — D259 Leiomyoma of uterus, unspecified: Secondary | ICD-10-CM

## 2018-04-02 NOTE — Telephone Encounter (Signed)
Referral placed at [facility] they will call to schedule. 

## 2018-04-02 NOTE — Progress Notes (Signed)
Vanessa Shaffer 10-30-1940 [id number]        77 y.o.  G2P2002 for breast and pelvic exam.  Past medical history,surgical history, problem list, medications, allergies, family history and social history were all reviewed and documented as reviewed in the EPIC chart.  ROS:  Performed with pertinent positives and negatives included in the history, assessment and plan.   Additional significant findings : None   Exam: Vanessa Shaffer assistant Vitals:   04/02/18 0933  BP: 118/74  Weight: 120 lb (54.4 kg)  Height: 5\' 5"  (1.651 m)   Body mass index is 19.97 kg/m.  General appearance:  Normal affect, orientation and appearance. Skin: Grossly normal HEENT: Without gross lesions.  No cervical or supraclavicular adenopathy. Thyroid normal.  Lungs:  Clear without wheezing, rales or rhonchi Cardiac: RR, without RMG Abdominal:  Soft, nontender, without masses, guarding, rebound, organomegaly or hernia Breasts:  Examined lying and sitting without masses, retractions, discharge or axillary adenopathy.  Nodularity noted right tail of Spence. Pelvic:  Ext, BUS, Vagina: With atrophic changes  Cervix: With atrophic changes  Uterus: Anteverted, normal size, shape and contour, midline and mobile nontender   Adnexa: Without masses or tenderness    Anus and perineum: Normal   Rectovaginal: Normal sphincter tone without palpated masses or tenderness.    Assessment/Plan:  77 y.o. [id number] female for breast and pelvic exam  1. Postmenopausal/atrophic genital changes.  Been on vaginal estradiol cream but prescription ran out and has not been on this for several months.  Does not notice a significant issue with dryness.  Options to reinitiate now versus wait and see how she does discussed.  We have discussed the risks of potential absorption including systemic effects such as thrombosis, breast and uterine stimulation.  At this point the patient prefers to stay off of the vaginal estrogen and see how  she does.  She will call if this becomes an issue. 2. Right breast tail of Spence nodularity.  Patient has been followed over the past several years with nodularity in the right tail of Spence.  Had diagnostic mammography and ultrasound which were benign.  Saw Dr. Dora Sims in 2016 who recommended expectant management.  Patient notes that this area has not changed on self breast exams.  Recent mammography 08/2017 was negative.  My exam today again shows some nodularity unchanged from prior exams.  We rediscussed the issues of rule out cancer and the options to continue as is with annual mammography and self breast exams with reporting any changes, consider additional studies such as MRI and lastly reexamination by Dr. Harlow Asa and follow his recommendations.  After lengthy discussion I recommended the patient follow-up with Dr. Harlow Asa for reexamination and his recommendations as far as follow-up.  Patient will follow-up with him and will call us if she does not hear from his office to arrange appointment within the next several weeks. 3. History of leiomyoma.  Exam overall is normal.  Continue with annual exam surveillance. 4. Pap smear 2018.  No Pap smear done today.  No history of abnormal Pap smears.  Options to stop screening per current screening guidelines versus less frequent screening intervals reviewed. 5. Colonoscopy 2014.  Repeat at their recommended interval. 6. Osteopenia.  DEXA 2017 T score -2.1.  Stable from prior DEXA.  FRAX 17% / 7.5%.  We have discussed increased FRAX for the hip and treatment options and she has declined treatment in the past.  Recommend follow-up DEXA now at 2-year interval and she  agrees to schedule in follow-up for this. 7. Health maintenance.  No routine lab work done as patient does this elsewhere.  Follow-up for DEXA.  Follow-up with Dr. Harlow Asa as arranged.  Follow-up in 1 year for annual exam.   [name] MD, 11:31 AM 04/02/2018

## 2018-04-02 NOTE — Telephone Encounter (Signed)
-----   Message from [name], MD sent at 04/02/2018 11:05 AM EDT ----- General surgical consult with Dr. Armandina Gemma reference follow-up right breast nodularity.  Patient seen by him before

## 2018-04-02 NOTE — Patient Instructions (Signed)
Follow-up for the bone density as scheduled.  Follow-up with Dr. Harlow Asa as arranged.  Call my office if you do not hear from his office within several weeks to arrange the appointment.  Call if vaginal dryness becomes an issue and you want to reinitiate vaginal estrogen cream.

## 2018-04-03 NOTE — Telephone Encounter (Signed)
Patient scheduled on 04/18/18 @ 4:30pm with Dr. Harlow Asa

## 2018-05-07 DIAGNOSIS — N6012 Diffuse cystic mastopathy of left breast: Secondary | ICD-10-CM | POA: Diagnosis not present

## 2018-05-07 DIAGNOSIS — N6310 Unspecified lump in the right breast, unspecified quadrant: Secondary | ICD-10-CM | POA: Diagnosis not present

## 2018-05-07 DIAGNOSIS — N6011 Diffuse cystic mastopathy of right breast: Secondary | ICD-10-CM | POA: Diagnosis not present

## 2018-05-29 DIAGNOSIS — Z23 Encounter for immunization: Secondary | ICD-10-CM | POA: Diagnosis not present

## 2018-07-02 DIAGNOSIS — Z23 Encounter for immunization: Secondary | ICD-10-CM | POA: Diagnosis not present

## 2018-07-02 DIAGNOSIS — L905 Scar conditions and fibrosis of skin: Secondary | ICD-10-CM | POA: Diagnosis not present

## 2018-07-02 DIAGNOSIS — L570 Actinic keratosis: Secondary | ICD-10-CM | POA: Diagnosis not present

## 2018-07-11 DIAGNOSIS — M810 Age-related osteoporosis without current pathological fracture: Secondary | ICD-10-CM

## 2018-07-11 HISTORY — DX: Age-related osteoporosis without current pathological fracture: M81.0

## 2018-07-30 ENCOUNTER — Other Ambulatory Visit: Payer: Self-pay | Admitting: Gynecology

## 2018-07-30 ENCOUNTER — Ambulatory Visit (INDEPENDENT_AMBULATORY_CARE_PROVIDER_SITE_OTHER): Payer: Medicare Other

## 2018-07-30 DIAGNOSIS — M810 Age-related osteoporosis without current pathological fracture: Secondary | ICD-10-CM

## 2018-07-30 DIAGNOSIS — Z780 Asymptomatic menopausal state: Secondary | ICD-10-CM

## 2018-07-30 DIAGNOSIS — M8580 Other specified disorders of bone density and structure, unspecified site: Secondary | ICD-10-CM

## 2018-07-31 ENCOUNTER — Encounter: Payer: Self-pay | Admitting: Gynecology

## 2018-08-06 ENCOUNTER — Telehealth: Payer: Self-pay

## 2018-08-06 NOTE — Telephone Encounter (Signed)
Patient called in voice mail stating that Dr. Loetta Rough sent her bone density result to My Chart and she does not  Have a My CHart and asked that I mail the result to her. I printed her My Chart message from Dr. Loetta Rough and mailed it to her. I also included a letter with the support phone number for My Chart and explained that she does indeed have a My Chart and she had to set that up although someone at the hospital or another office may have facilitated that.  I asked her to call and deactivate it if she plans not to use it. Otherwise that support number can help her obtain password so she can use it in the future whichever she choses.

## 2018-08-06 NOTE — Telephone Encounter (Signed)
Patient called about his stating she does not have a My Chart and asked me to mail result to her. I printed this My Chart message and mailed It to her.

## 2018-08-19 DIAGNOSIS — Z800 Family history of malignant neoplasm of digestive organs: Secondary | ICD-10-CM | POA: Diagnosis not present

## 2018-08-19 DIAGNOSIS — K5730 Diverticulosis of large intestine without perforation or abscess without bleeding: Secondary | ICD-10-CM | POA: Diagnosis not present

## 2018-08-21 DIAGNOSIS — Z1231 Encounter for screening mammogram for malignant neoplasm of breast: Secondary | ICD-10-CM | POA: Diagnosis not present

## 2018-09-24 DIAGNOSIS — Z0000 Encounter for general adult medical examination without abnormal findings: Secondary | ICD-10-CM | POA: Diagnosis not present

## 2018-09-24 DIAGNOSIS — E7800 Pure hypercholesterolemia, unspecified: Secondary | ICD-10-CM | POA: Diagnosis not present

## 2018-09-24 DIAGNOSIS — Z1389 Encounter for screening for other disorder: Secondary | ICD-10-CM | POA: Diagnosis not present

## 2018-09-24 DIAGNOSIS — M810 Age-related osteoporosis without current pathological fracture: Secondary | ICD-10-CM | POA: Diagnosis not present

## 2018-09-24 DIAGNOSIS — R7303 Prediabetes: Secondary | ICD-10-CM | POA: Diagnosis not present

## 2018-09-24 DIAGNOSIS — E559 Vitamin D deficiency, unspecified: Secondary | ICD-10-CM | POA: Diagnosis not present

## 2018-09-24 DIAGNOSIS — I10 Essential (primary) hypertension: Secondary | ICD-10-CM | POA: Diagnosis not present

## 2018-09-24 DIAGNOSIS — M79646 Pain in unspecified finger(s): Secondary | ICD-10-CM | POA: Diagnosis not present

## 2018-09-24 DIAGNOSIS — N183 Chronic kidney disease, stage 3 (moderate): Secondary | ICD-10-CM | POA: Diagnosis not present

## 2019-03-26 DIAGNOSIS — L723 Sebaceous cyst: Secondary | ICD-10-CM | POA: Diagnosis not present

## 2019-03-26 DIAGNOSIS — L821 Other seborrheic keratosis: Secondary | ICD-10-CM | POA: Diagnosis not present

## 2019-04-15 DIAGNOSIS — L723 Sebaceous cyst: Secondary | ICD-10-CM | POA: Diagnosis not present

## 2019-05-21 DIAGNOSIS — Z23 Encounter for immunization: Secondary | ICD-10-CM | POA: Diagnosis not present

## 2019-06-18 ENCOUNTER — Encounter: Payer: Self-pay | Admitting: Gynecology

## 2019-07-13 ENCOUNTER — Other Ambulatory Visit: Payer: Self-pay

## 2019-07-13 ENCOUNTER — Encounter: Payer: Self-pay | Admitting: Gynecology

## 2019-07-13 ENCOUNTER — Ambulatory Visit (INDEPENDENT_AMBULATORY_CARE_PROVIDER_SITE_OTHER): Payer: Medicare Other | Admitting: Gynecology

## 2019-07-13 VITALS — BP 130/84 | Ht 64.0 in | Wt 115.0 lb

## 2019-07-13 DIAGNOSIS — N952 Postmenopausal atrophic vaginitis: Secondary | ICD-10-CM

## 2019-07-13 DIAGNOSIS — M810 Age-related osteoporosis without current pathological fracture: Secondary | ICD-10-CM

## 2019-07-13 DIAGNOSIS — N6331 Unspecified lump in axillary tail of the right breast: Secondary | ICD-10-CM | POA: Diagnosis not present

## 2019-07-13 DIAGNOSIS — Z01419 Encounter for gynecological examination (general) (routine) without abnormal findings: Secondary | ICD-10-CM

## 2019-07-13 DIAGNOSIS — Z124 Encounter for screening for malignant neoplasm of cervix: Secondary | ICD-10-CM | POA: Diagnosis not present

## 2019-07-13 NOTE — Progress Notes (Signed)
    Vanessa Shaffer 08-15-1941 [id number]        78 y.o.  G2P2002 for breast and pelvic exam.  Past medical history,surgical history, problem list, medications, allergies, family history and social history were all reviewed and documented as reviewed in the EPIC chart.  ROS:  Performed with pertinent positives and negatives included in the history, assessment and plan.   Additional significant findings : None   Exam: [name] assistant Vitals:   07/13/19 1016  BP: 130/84  Weight: 115 lb (52.2 kg)  Height: 5\' 4"  (1.626 m)   Body mass index is 19.74 kg/m.  General appearance:  Normal affect, orientation and appearance. Skin: Grossly normal HEENT: Without gross lesions.  No cervical or supraclavicular adenopathy. Thyroid normal.  Lungs:  Clear without wheezing, rales or rhonchi Cardiac: RR, without RMG Abdominal:  Soft, nontender, without masses, guarding, rebound, organomegaly or hernia Breasts:  Examined lying and sitting.  Left without masses, retractions, discharge or axillary adenopathy.  Right with nodularity in the tail of Spence.  No other masses, retractions, discharge or adenopathy Pelvic:  Ext, BUS, Vagina: With atrophic changes  Cervix: With atrophic changes.  Pap smear done  Uterus: Anteverted, normal size, shape and contour, midline and mobile nontender   Adnexa: Without masses or tenderness    Anus and perineum: Normal   Rectovaginal: Normal sphincter tone without palpated masses or tenderness.    Assessment/Plan:  78 y.o. [id number] female for breast and pelvic exam  1. Postmenopausal.  No significant menopausal symptoms or any vaginal bleeding. 2. Persistent nodularity right tail of Spence.  Evaluation in the past to include mammogram and ultrasound negative.  Saw Dr. Harlow Asa last year in follow-up.  No intervention was recommended although I do not have a copy of his note and I will obtain a copy and review it.  The nodularity has remained unchanged on her  self breast exams as well as my exams.  We again discussed the issues of cannot guarantee as far as cancer and options for ongoing management to include mammogram, ultrasound and MRI screening.  We also discussed the options of excision of the palpable area which I assume had been discussed with her by Dr. Harlow Asa.  Will review his note and then make recommendations as far as follow-up studies. 3. Osteoporosis.  DEXA 07/2018 T score -2.7.  I had asked her at that time to make an appointment to discuss treatment options which did not occur.  Today we discussed her increased risk of fracture and options for management to include medication such as bisphosphate's or Prolia.  She reports having taken Fosamax a number of years ago which I do not have records of in epic.  I discussed recommendations to start medication now to decrease her acute fracture risk as well as decrease ongoing calcium loss and future fracture risk.  The patient is active takes calcium and vitamin D.  She declines medication at this time understanding her increased fracture risk. 4. Colonoscopy 2019.  Repeat at their recommended interval. 5. Pap smear 2018.  Pap smear done today at the patient's request.  No history of abnormal Pap smears previously. 6. Health maintenance.  No routine lab work done as patient does this elsewhere.  Follow-up 1 year, sooner as needed.   [name] MD, 11:05 AM 07/13/2019

## 2019-07-13 NOTE — Addendum Note (Signed)
Addended by: Nelva Nay on: 07/13/2019 11:46 AM   Modules accepted: Orders

## 2019-07-13 NOTE — Patient Instructions (Signed)
Office will follow-up with you after review of Dr. Gala Lewandowsky note with recommendations.  Call if you change your mind about medication for osteoporosis.  Follow-up in 1 year for annual exam

## 2019-07-14 LAB — PAP IG W/ RFLX HPV ASCU

## 2019-07-15 ENCOUNTER — Telehealth: Payer: Self-pay | Admitting: Gynecology

## 2019-07-15 NOTE — Telephone Encounter (Signed)
Tell patient I reviewed Dr. Gala Lewandowsky note from August 2019.  He had recommended continued observation with continued annual mammography.  I would recommend a diagnostic mammogram and ultrasound of the right breast reference persistent nodularity right tail of Spence to be scheduled in December when she is due for her regular mammogram follow-up.

## 2019-07-20 NOTE — Telephone Encounter (Signed)
Pt informed and order will be sent to [facility] for patient to schedule when time for her mammogram. Sharrie Rothman CMA/TF

## 2019-07-30 ENCOUNTER — Telehealth: Payer: Self-pay | Admitting: *Deleted

## 2019-07-30 DIAGNOSIS — N6311 Unspecified lump in the right breast, upper outer quadrant: Secondary | ICD-10-CM | POA: Diagnosis not present

## 2019-07-30 NOTE — Telephone Encounter (Signed)
Do not have a copy of this report.  Do not know whether the area they want a biopsy is that same area she was feeling.  That is a question I would have her ask the radiologist.  If it is this nodule she was feeling then having it excised by Dr. Harlow Asa would be a good idea.  If it is a different area than it will need to be sampled regardless.

## 2019-07-30 NOTE — Telephone Encounter (Signed)
Patient informed, asked if I could call to request copy of report at [facility].

## 2019-07-30 NOTE — Telephone Encounter (Signed)
(  follow up from telephone encounter on 07/15/19) Patient had diagnostic mammogram and ultrasound of the right breast this am, was told by radiologist to come back on Dec 3rd for Bx (this is the earliest appointment). Patient asked if you think it would best just to schedule a visit with Dr.Gerkin to have nodule removed? Please advise

## 2019-07-31 ENCOUNTER — Encounter: Payer: Self-pay | Admitting: Gynecology

## 2019-08-12 DIAGNOSIS — H52223 Regular astigmatism, bilateral: Secondary | ICD-10-CM | POA: Diagnosis not present

## 2019-08-12 DIAGNOSIS — H25813 Combined forms of age-related cataract, bilateral: Secondary | ICD-10-CM | POA: Diagnosis not present

## 2019-08-12 DIAGNOSIS — H5213 Myopia, bilateral: Secondary | ICD-10-CM | POA: Diagnosis not present

## 2019-08-12 DIAGNOSIS — H524 Presbyopia: Secondary | ICD-10-CM | POA: Diagnosis not present

## 2019-09-17 ENCOUNTER — Telehealth: Payer: Self-pay | Admitting: *Deleted

## 2019-09-17 NOTE — Telephone Encounter (Signed)
Janett Billow called from [place] regarding patient not scheduled her breast Bx that is highly suspicious for breast cancer as recommended by radiologist in 07/2019. Patient told Janett Billow that Dr.Fontaine told her to follow up with Dr.Gerkin regarding this (see breast ultrasound result note on 07/30/19) I called Janett Billow and re-read her Dr.Fontaine recommendations. Janett Billow said patient is scheduled with Dr.Gerkin later this month and that the radiologist at [facility] is worried about her appointment being so late in the month with this highly  suspicious area patient should be seen sooner than later and [facility] doesn't use Dr.Gerkin as a "breast cancer doctor" if this is cancer the radiologist at [facility] is concerned she will only be referred back to their office once patient has seen Dr.Gerkin. Janett Billow asked if she thought it would be okay to call the patient and relay to her once again this highly suspicious area and the need for a breast bx is recommended.  I told her I think that would be fine, but the decision is totally up to the patient and once again Dr.Fontaine did receive the report and gave his recommendation regarding this. Janett Billow is fully aware, Dr.Fontiane has retired and I am just relaying his recommendation noted in chart.

## 2019-09-23 ENCOUNTER — Other Ambulatory Visit: Payer: Self-pay | Admitting: Radiology

## 2019-09-23 DIAGNOSIS — R928 Other abnormal and inconclusive findings on diagnostic imaging of breast: Secondary | ICD-10-CM | POA: Diagnosis not present

## 2019-09-23 DIAGNOSIS — N6311 Unspecified lump in the right breast, upper outer quadrant: Secondary | ICD-10-CM | POA: Diagnosis not present

## 2019-09-24 ENCOUNTER — Ambulatory Visit: Payer: Medicare Other | Attending: Internal Medicine

## 2019-09-24 DIAGNOSIS — Z23 Encounter for immunization: Secondary | ICD-10-CM | POA: Diagnosis not present

## 2019-09-24 NOTE — Progress Notes (Signed)
   Covid-19 Vaccination Clinic  Name:  Vanessa Shaffer    MRN: [id number] DOB: March 04, 1941  09/24/2019  Vanessa Shaffer was observed post Covid-19 immunization for 15 minutes without incidence. She was provided with Vaccine Information Sheet and instruction to access the V-Safe system.   Vanessa Shaffer was instructed to call 911 with any severe reactions post vaccine: Marland Kitchen Difficulty breathing  . Swelling of your face and throat  . A fast heartbeat  . A bad rash all over your body  . Dizziness and weakness    Immunizations Administered    Name Date Dose VIS Date Route   Pfizer COVID-19 Vaccine 09/24/2019 12:11 PM 0.3 mL 08/21/2019 Intramuscular   Manufacturer: [employer], [employer]   Lot: [id number]   [place]: [id number]

## 2019-09-30 ENCOUNTER — Encounter: Payer: Self-pay | Admitting: Gynecology

## 2019-10-07 ENCOUNTER — Other Ambulatory Visit: Payer: Self-pay | Admitting: Radiology

## 2019-10-07 DIAGNOSIS — C50411 Malignant neoplasm of upper-outer quadrant of right female breast: Secondary | ICD-10-CM | POA: Diagnosis not present

## 2019-10-07 DIAGNOSIS — R921 Mammographic calcification found on diagnostic imaging of breast: Secondary | ICD-10-CM | POA: Diagnosis not present

## 2019-10-14 ENCOUNTER — Ambulatory Visit: Payer: Self-pay | Admitting: Surgery

## 2019-10-14 ENCOUNTER — Encounter: Payer: Self-pay | Admitting: Adult Health

## 2019-10-14 ENCOUNTER — Ambulatory Visit: Payer: Medicare Other | Attending: Internal Medicine

## 2019-10-14 DIAGNOSIS — C50411 Malignant neoplasm of upper-outer quadrant of right female breast: Secondary | ICD-10-CM | POA: Insufficient documentation

## 2019-10-14 DIAGNOSIS — Z23 Encounter for immunization: Secondary | ICD-10-CM

## 2019-10-14 NOTE — Progress Notes (Signed)
   Covid-19 Vaccination Clinic  Name:  Vanessa Shaffer    MRN: [id number] DOB: [birth year]  10/14/2019  [name] was observed post Covid-19 immunization for 15 minutes without incidence. She was provided with Vaccine Information Sheet and instruction to access the V-Safe system.   [name] was instructed to call 911 with any severe reactions post vaccine: Marland Kitchen Difficulty breathing  . Swelling of your face and throat  . A fast heartbeat  . A bad rash all over your body  . Dizziness and weakness    Immunizations Administered    Name Date Dose VIS Date Route   Pfizer COVID-19 Vaccine 10/14/2019  9:06 AM 0.3 mL 08/21/2019 Intramuscular   Manufacturer: [place]   Lot: [id number]   [place]: [id number]

## 2019-10-15 ENCOUNTER — Other Ambulatory Visit: Payer: Self-pay

## 2019-10-15 ENCOUNTER — Telehealth: Payer: Self-pay | Admitting: Oncology

## 2019-10-15 ENCOUNTER — Encounter (HOSPITAL_BASED_OUTPATIENT_CLINIC_OR_DEPARTMENT_OTHER)
Admission: RE | Admit: 2019-10-15 | Discharge: 2019-10-15 | Disposition: A | Discharge status: Home / Self Care | Payer: Medicare Other | Source: Ambulatory Visit | Attending: Surgery | Admitting: Surgery

## 2019-10-15 ENCOUNTER — Encounter (HOSPITAL_BASED_OUTPATIENT_CLINIC_OR_DEPARTMENT_OTHER): Payer: Self-pay | Admitting: Surgery

## 2019-10-15 ENCOUNTER — Encounter: Payer: Self-pay | Admitting: Obstetrics & Gynecology

## 2019-10-15 DIAGNOSIS — Z01812 Encounter for preprocedural laboratory examination: Secondary | ICD-10-CM | POA: Diagnosis not present

## 2019-10-15 LAB — BASIC METABOLIC PANEL
Anion gap: 12 (ref 5–15)
BUN: 19 mg/dL (ref 8–23)
CO2: 29 mmol/L (ref 22–32)
Calcium: 9.4 mg/dL (ref 8.9–10.3)
Chloride: 100 mmol/L (ref 98–111)
Creatinine, Ser: 0.94 mg/dL (ref 0.44–1.00)
GFR calc Af Amer: >60 mL/min (ref >60)
GFR calc non Af Amer: 58 mL/min — ABNORMAL LOW (ref >60)
Glucose, Bld: 126 mg/dL — ABNORMAL HIGH (ref 70–99)
Potassium: 3.9 mmol/L (ref 3.5–5.1)
Sodium: 141 mmol/L (ref 135–145)

## 2019-10-15 MED ORDER — ENSURE PRE-SURGERY PO LIQD: 296.0000 mL | Freq: Once | ORAL | Status: DC

## 2019-10-15 NOTE — Progress Notes (Signed)

## 2019-10-15 NOTE — Progress Notes (Signed)
EKG reviewed by Dr. Odonno, will proceed with surgery as scheduled. 

## 2019-10-15 NOTE — Telephone Encounter (Signed)
Received a new pt referral from Dr. Harlow Asa from [facility] for a new dx of cancer. Vanessa Shaffer has been cld and scheduled to see Dr. Jana Hakim on 2/22 at 4pm w/labs at 330pm. Pt aware to arrive 15 minutes early.

## 2019-10-16 ENCOUNTER — Other Ambulatory Visit (HOSPITAL_COMMUNITY)
Admission: RE | Admit: 2019-10-16 | Discharge: 2019-10-16 | Disposition: A | Discharge status: Home / Self Care | Payer: Medicare Other | Source: Ambulatory Visit | Attending: Surgery | Admitting: Surgery

## 2019-10-16 ENCOUNTER — Encounter: Payer: Self-pay | Admitting: *Deleted

## 2019-10-16 DIAGNOSIS — Z01812 Encounter for preprocedural laboratory examination: Secondary | ICD-10-CM | POA: Diagnosis not present

## 2019-10-16 DIAGNOSIS — Z20822 Contact with and (suspected) exposure to covid-19: Secondary | ICD-10-CM | POA: Insufficient documentation

## 2019-10-16 LAB — SARS CORONAVIRUS 2 (TAT 6-24 HRS): SARS Coronavirus 2: NEGATIVE

## 2019-10-18 ENCOUNTER — Encounter (HOSPITAL_BASED_OUTPATIENT_CLINIC_OR_DEPARTMENT_OTHER): Payer: Self-pay | Admitting: Surgery

## 2019-10-18 NOTE — H&P (Signed)
General Surgery [facility]  Vanessa Shaffer DOB: [birth year] Married / Language: English / Race: White Female   History of Present Illness  The patient is a 79 year old female who presents with breast cancer.  CHIEF COMPLAINT: right breast cancer  Patient returns for surgical follow-up having undergone stereotactic core needle biopsy of right breast mass. Initial biopsy had benign cytopathology. However, the area of architectural distortion on the mammogram was concerning enough to warrant a second biopsy performed by Dr. Johnnette Gourd. 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 Tumor dimension appeared to be approximately 1.2 cm. I discussed the case with Dr. Isaiah Blakes as well as with my partner, Dr. Donne Hazel, and the case was presented this morning at the breast cancer conference. Tumor is estrogen receptor positive. This is a T1c lesion. The committee felt that lymph node sampling was not required. Therefore the patient presents today to discuss partial mastectomy in the near future.   Problem List/Past Medical BENIGN BREAST LUMPS, RIGHT (N60.21)  BREAST MASS, RIGHT (N63.10)  FIBROCYSTIC BREAST CHANGES, BILATERAL (N60.11, N60.12)   Past Surgical History  Breast Biopsy  Left, Right. Thyroid Surgery   Diagnostic Studies History  Colonoscopy  1-5 years ago Mammogram  within last year Pap Smear  1-5 years ago  Allergies Sulfa Antibiotics  Hives. Terazol 3 *VAGINAL PRODUCTS*  Itching. Ambien *HYPNOTICS/SEDATIVES/SLEEP DISORDER AGENTS*  Irregular heart rate. Codeine Phosphate *ANALGESICS - OPIOID*  Irregular heart rate.  Medication History Metoprolol Succinate ER (50MG  Tablet ER 24HR, Oral) Active. Aspirin Low Strength (81MG  Tablet Chewable, Oral) Active. Lipitor (10MG  Tablet, Oral) Active. Toprol XL (25MG  Tablet ER 24HR, Oral) Active. Calcium-D  (270-170-400MG -MG-IU Tablet, Oral) Active. Medications Reconciled  Social History Alcohol use  Occasional alcohol use. Caffeine use  Coffee. No drug use  Tobacco use  Never smoker. No caffeine use   Family History  Colon Polyps  Father. Hypertension  Mother. Arthritis  Mother. Colon Cancer  Father. Heart Disease  Father.  Pregnancy / Birth History Age at menarche  19 years. Age of menopause  51-55 Contraceptive History  Oral contraceptives. Gravida  2 Maternal age  54-25 Para  2  Other Problems High blood pressure  Hypercholesterolemia   Vitals Weight: 117.2 lb Height: 65in Body Surface Area: 1.58 m Body Mass Index: 19.5 kg/m  Temp.: 97.109F(Thermal Scan)  Pulse: 98 (Regular)  BP: 122/70 (Sitting, Left Arm, Standard)  Physical Exam   GENERAL APPEARANCE Development: normal Nutritional status: normal Gross deformities: none  SKIN Rash, lesions, ulcers: none Induration, erythema: none Nodules: none palpable  EYES Conjunctiva and lids: normal Pupils: equal and reactive Iris: normal bilaterally  EARS, NOSE, MOUTH, THROAT External ears: no lesion or deformity External nose: no lesion or deformity Hearing: grossly normal Patient is wearing a mask.  NECK Symmetric: yes Trachea: midline Thyroid: no palpable nodules in the thyroid bed  CHEST Respiratory effort: normal Retraction or accessory muscle use: no Breath sounds: normal bilaterally Rales, rhonchi, wheeze: none  CARDIOVASCULAR Auscultation: regular rhythm, normal rate Murmurs: none Pulses: carotid and radial pulse 2+ palpable Lower extremity edema: none  BREAST Limited to right breast. Biopsy sites are easily seen and appear to be healing uneventfully. No sign of infection. Palpation shows mild to moderate tenderness. There is a palpable mass in the upper outer quadrant at approximately the 10:30 position on the periphery of the breast parenchyma. There is no  sign of seroma  or hematoma. Palpation in the right axilla shows no lymphadenopathy.  MUSCULOSKELETAL Station and gait: normal Digits and nails: no clubbing or cyanosis Muscle strength: grossly normal all extremities Range of motion: grossly normal all extremities Deformity: none  LYMPHATIC Cervical: none palpable Supraclavicular: none palpable  PSYCHIATRIC Oriented to person, place, and time: yes Mood and affect: normal for situation Judgment and insight: appropriate for situation    Assessment & Plan  CARCINOMA OF BREAST UPPER OUTER QUADRANT, RIGHT (C50.411)  Patient returns today following stereotactic core needle biopsy to discuss the findings of invasive ductal carcinoma of the right breast. Patient is provided with copies of her pathology results.  The case has been discussed with the patient's radiologist, with my partners, and with the breast cancer conference group earlier this morning. All are in agreement that proceeding with right partial mastectomy is the indicated procedure. I discussed this today with the patient. We discussed the surgical procedure and its risk and complications including the risk of seroma or hematoma formation. This will be done as an outpatient surgical procedure. We discussed the need for postoperative radiation therapy. We discussed the fact that she would be seen in consultation by medical oncology once the pathology results were available for review. We discussed the fact that she would likely be on antiestrogen medication for several years.  Patient understands the proposed procedure and wishes to proceed in the immediate future. We will make arrangements at a time convenient for the patient.  The risks and benefits of the procedure have been discussed at length with the patient. The patient understands the proposed procedure, potential alternative treatments, and the course of recovery to be expected. All of the patient's questions  have been answered at this time. The patient wishes to proceed with surgery.  Armandina Gemma, MD [facility] Office: [phone]

## 2019-10-20 ENCOUNTER — Ambulatory Visit (HOSPITAL_BASED_OUTPATIENT_CLINIC_OR_DEPARTMENT_OTHER): Payer: Medicare Other | Admitting: Anesthesiology

## 2019-10-20 ENCOUNTER — Other Ambulatory Visit: Payer: Self-pay

## 2019-10-20 ENCOUNTER — Encounter (HOSPITAL_BASED_OUTPATIENT_CLINIC_OR_DEPARTMENT_OTHER): Payer: Self-pay | Admitting: Surgery

## 2019-10-20 ENCOUNTER — Encounter (HOSPITAL_BASED_OUTPATIENT_CLINIC_OR_DEPARTMENT_OTHER)
Admission: RE | Disposition: A | Discharge status: Home / Self Care | Payer: Self-pay | Source: Home / Self Care | Attending: Surgery

## 2019-10-20 ENCOUNTER — Encounter: Payer: Self-pay | Admitting: Oncology

## 2019-10-20 ENCOUNTER — Ambulatory Visit (HOSPITAL_BASED_OUTPATIENT_CLINIC_OR_DEPARTMENT_OTHER)
Admission: RE | Admit: 2019-10-20 | Discharge: 2019-10-20 | Disposition: A | Discharge status: Home / Self Care | Payer: Medicare Other | Attending: Surgery | Admitting: Surgery

## 2019-10-20 DIAGNOSIS — Z8249 Family history of ischemic heart disease and other diseases of the circulatory system: Secondary | ICD-10-CM | POA: Diagnosis not present

## 2019-10-20 DIAGNOSIS — Z7982 Long term (current) use of aspirin: Secondary | ICD-10-CM | POA: Diagnosis not present

## 2019-10-20 DIAGNOSIS — E7800 Pure hypercholesterolemia, unspecified: Secondary | ICD-10-CM | POA: Diagnosis not present

## 2019-10-20 DIAGNOSIS — I10 Essential (primary) hypertension: Secondary | ICD-10-CM | POA: Insufficient documentation

## 2019-10-20 DIAGNOSIS — Z800 Family history of malignant neoplasm of digestive organs: Secondary | ICD-10-CM | POA: Insufficient documentation

## 2019-10-20 DIAGNOSIS — E785 Hyperlipidemia, unspecified: Secondary | ICD-10-CM | POA: Insufficient documentation

## 2019-10-20 DIAGNOSIS — C50411 Malignant neoplasm of upper-outer quadrant of right female breast: Secondary | ICD-10-CM | POA: Diagnosis not present

## 2019-10-20 DIAGNOSIS — N6011 Diffuse cystic mastopathy of right breast: Secondary | ICD-10-CM | POA: Diagnosis not present

## 2019-10-20 DIAGNOSIS — N6012 Diffuse cystic mastopathy of left breast: Secondary | ICD-10-CM | POA: Diagnosis not present

## 2019-10-20 DIAGNOSIS — Z8371 Family history of colonic polyps: Secondary | ICD-10-CM | POA: Diagnosis not present

## 2019-10-20 DIAGNOSIS — Z885 Allergy status to narcotic agent status: Secondary | ICD-10-CM | POA: Insufficient documentation

## 2019-10-20 DIAGNOSIS — Z888 Allergy status to other drugs, medicaments and biological substances status: Secondary | ICD-10-CM | POA: Insufficient documentation

## 2019-10-20 DIAGNOSIS — Z8261 Family history of arthritis: Secondary | ICD-10-CM | POA: Insufficient documentation

## 2019-10-20 DIAGNOSIS — Z79899 Other long term (current) drug therapy: Secondary | ICD-10-CM | POA: Diagnosis not present

## 2019-10-20 DIAGNOSIS — Z170 Estrogen receptor positive status [ER+]: Secondary | ICD-10-CM | POA: Insufficient documentation

## 2019-10-20 DIAGNOSIS — C50911 Malignant neoplasm of unspecified site of right female breast: Secondary | ICD-10-CM | POA: Diagnosis not present

## 2019-10-20 HISTORY — PX: MASTECTOMY, PARTIAL: SHX709

## 2019-10-20 SURGERY — MASTECTOMY PARTIAL
Anesthesia: General | Site: Breast | Laterality: Right

## 2019-10-20 MED ORDER — FENTANYL CITRATE (PF) 100 MCG/2ML IJ SOLN: 25.0000 ug | INTRAMUSCULAR | Status: DC | PRN

## 2019-10-20 MED ORDER — TRAMADOL HCL 50 MG PO TABS: 50.0000 mg | ORAL_TABLET | Freq: Four times a day (QID) | ORAL | 0 refills | Status: DC | PRN

## 2019-10-20 MED ORDER — DEXAMETHASONE SODIUM PHOSPHATE 10 MG/ML IJ SOLN
INTRAMUSCULAR | Status: DC | PRN
  Administered 2019-10-20: 5 mg via INTRAVENOUS

## 2019-10-20 MED ORDER — GABAPENTIN 300 MG PO CAPS: 300.0000 mg | ORAL_CAPSULE | ORAL | Status: DC

## 2019-10-20 MED ORDER — CHLORHEXIDINE GLUCONATE CLOTH 2 % EX PADS: 6.0000 | MEDICATED_PAD | Freq: Once | CUTANEOUS | Status: DC

## 2019-10-20 MED ORDER — ONDANSETRON HCL 4 MG/2ML IJ SOLN
INTRAMUSCULAR | Status: DC | PRN
  Administered 2019-10-20: 4 mg via INTRAVENOUS

## 2019-10-20 MED ORDER — CEFAZOLIN SODIUM-DEXTROSE 2-4 GM/100ML-% IV SOLN
2.0000 g | INTRAVENOUS | Status: AC
  Administered 2019-10-20: 2 g via INTRAVENOUS

## 2019-10-20 MED ORDER — BUPIVACAINE HCL (PF) 0.5 % IJ SOLN
INTRAMUSCULAR | Status: DC | PRN
  Administered 2019-10-20: 20 mL

## 2019-10-20 MED ORDER — ACETAMINOPHEN 500 MG PO TABS
1000.0000 mg | ORAL_TABLET | Freq: Once | ORAL | Status: AC
  Administered 2019-10-20: 1000 mg via ORAL

## 2019-10-20 MED ORDER — KETOROLAC TROMETHAMINE 30 MG/ML IJ SOLN
INTRAMUSCULAR | Status: AC
  Filled 2019-10-20: qty 1

## 2019-10-20 MED ORDER — PROPOFOL 500 MG/50ML IV EMUL: INTRAVENOUS | Status: DC | PRN

## 2019-10-20 MED ORDER — MIDAZOLAM HCL 2 MG/2ML IJ SOLN: 1.0000 mg | INTRAMUSCULAR | Status: DC | PRN

## 2019-10-20 MED ORDER — ACETAMINOPHEN 10 MG/ML IV SOLN: 1000.0000 mg | Freq: Once | INTRAVENOUS | Status: DC | PRN

## 2019-10-20 MED ORDER — ONDANSETRON HCL 4 MG/2ML IJ SOLN: 4.0000 mg | Freq: Once | INTRAMUSCULAR | Status: DC | PRN

## 2019-10-20 MED ORDER — KETOROLAC TROMETHAMINE 15 MG/ML IJ SOLN: 15.0000 mg | Freq: Once | INTRAMUSCULAR | Status: DC | PRN

## 2019-10-20 MED ORDER — FENTANYL CITRATE (PF) 100 MCG/2ML IJ SOLN
50.0000 ug | INTRAMUSCULAR | Status: DC | PRN
  Administered 2019-10-20: 50 ug via INTRAVENOUS

## 2019-10-20 MED ORDER — GABAPENTIN 300 MG PO CAPS
ORAL_CAPSULE | ORAL | Status: AC
  Filled 2019-10-20: qty 1

## 2019-10-20 MED ORDER — FENTANYL CITRATE (PF) 100 MCG/2ML IJ SOLN
INTRAMUSCULAR | Status: AC
  Filled 2019-10-20: qty 2

## 2019-10-20 MED ORDER — KETOROLAC TROMETHAMINE 30 MG/ML IJ SOLN
INTRAMUSCULAR | Status: DC | PRN
  Administered 2019-10-20: 15 mg via INTRAVENOUS

## 2019-10-20 MED ORDER — PROPOFOL 500 MG/50ML IV EMUL
INTRAVENOUS | Status: DC | PRN
  Administered 2019-10-20: 150 mg via INTRAVENOUS

## 2019-10-20 MED ORDER — LACTATED RINGERS IV SOLN: INTRAVENOUS | Status: DC

## 2019-10-20 MED ORDER — EPHEDRINE SULFATE-NACL 50-0.9 MG/10ML-% IV SOSY
PREFILLED_SYRINGE | INTRAVENOUS | Status: DC | PRN
  Administered 2019-10-20 (×2): 5 mg via INTRAVENOUS
  Administered 2019-10-20 (×2): 10 mg via INTRAVENOUS

## 2019-10-20 MED ORDER — ACETAMINOPHEN 500 MG PO TABS
ORAL_TABLET | ORAL | Status: AC
  Filled 2019-10-20: qty 2

## 2019-10-20 MED ORDER — LIDOCAINE 2% (20 MG/ML) 5 ML SYRINGE
INTRAMUSCULAR | Status: DC | PRN
  Administered 2019-10-20: 40 mg via INTRAVENOUS

## 2019-10-20 MED ORDER — CEFAZOLIN SODIUM-DEXTROSE 2-4 GM/100ML-% IV SOLN
INTRAVENOUS | Status: AC
  Filled 2019-10-20: qty 100

## 2019-10-20 SURGICAL SUPPLY — 48 items
ADH SKN CLS APL DERMABOND .7 (GAUZE/BANDAGES/DRESSINGS) ×1
APL PRP STRL LF DISP 70% ISPRP (MISCELLANEOUS) ×1
APPLIER CLIP 9.375 MED OPEN (MISCELLANEOUS) ×3
APR CLP MED 9.3 20 MLT OPN (MISCELLANEOUS) ×1
BINDER BREAST MEDIUM (GAUZE/BANDAGES/DRESSINGS) ×2 IMPLANT
BLADE SURG 15 STRL LF DISP TIS (BLADE) ×1 IMPLANT
BLADE SURG 15 STRL SS (BLADE) ×3
CANISTER SUCT 1200ML W/VALVE (MISCELLANEOUS) ×2 IMPLANT
CHLORAPREP W/TINT 26 (MISCELLANEOUS) ×3 IMPLANT
CLIP APPLIE 9.375 MED OPEN (MISCELLANEOUS) IMPLANT
CLOSURE WOUND 1/2 X4 (GAUZE/BANDAGES/DRESSINGS) ×1
COVER BACK TABLE 60X90IN (DRAPES) ×3 IMPLANT
COVER MAYO STAND STRL (DRAPES) ×3 IMPLANT
COVER WAND RF STERILE (DRAPES) IMPLANT
DECANTER SPIKE VIAL GLASS SM (MISCELLANEOUS) ×2 IMPLANT
DERMABOND ADVANCED (GAUZE/BANDAGES/DRESSINGS) ×2
DERMABOND ADVANCED .7 DNX12 (GAUZE/BANDAGES/DRESSINGS) IMPLANT
DRAPE LAPAROTOMY 100X72 PEDS (DRAPES) ×3 IMPLANT
DRAPE UTILITY XL STRL (DRAPES) ×3 IMPLANT
ELECT REM PT RETURN 9FT ADLT (ELECTROSURGICAL) ×3
ELECTRODE REM PT RTRN 9FT ADLT (ELECTROSURGICAL) ×1 IMPLANT
GAUZE SPONGE 4X4 12PLY STRL LF (GAUZE/BANDAGES/DRESSINGS) ×3 IMPLANT
GLOVE BIO SURGEON STRL SZ7 (GLOVE) ×2 IMPLANT
GLOVE BIOGEL PI IND STRL 7.5 (GLOVE) IMPLANT
GLOVE BIOGEL PI INDICATOR 7.5 (GLOVE) ×2
GLOVE SURG ORTHO 8.0 STRL STRW (GLOVE) ×3 IMPLANT
GOWN STRL REUS W/ TWL LRG LVL3 (GOWN DISPOSABLE) ×1 IMPLANT
GOWN STRL REUS W/ TWL XL LVL3 (GOWN DISPOSABLE) ×1 IMPLANT
GOWN STRL REUS W/TWL LRG LVL3 (GOWN DISPOSABLE) ×3
GOWN STRL REUS W/TWL XL LVL3 (GOWN DISPOSABLE) ×3
KIT MARKER MARGIN INK (KITS) ×2 IMPLANT
NDL HYPO 25X1 1.5 SAFETY (NEEDLE) ×1 IMPLANT
NEEDLE HYPO 25X1 1.5 SAFETY (NEEDLE) ×3 IMPLANT
NS IRRIG 1000ML POUR BTL (IV SOLUTION) ×3 IMPLANT
PACK BASIN DAY SURGERY FS (CUSTOM PROCEDURE TRAY) ×3 IMPLANT
PENCIL SMOKE EVACUATOR (MISCELLANEOUS) ×3 IMPLANT
SLEEVE SCD COMPRESS KNEE MED (MISCELLANEOUS) ×2 IMPLANT
SPONGE LAP 18X18 RF (DISPOSABLE) ×2 IMPLANT
STRIP CLOSURE SKIN 1/2X4 (GAUZE/BANDAGES/DRESSINGS) ×2 IMPLANT
SUT SILK 2 0 SH (SUTURE) IMPLANT
SUT VICRYL 3-0 CR8 SH (SUTURE) ×3 IMPLANT
SUT VICRYL 4-0 PS2 18IN ABS (SUTURE) ×3 IMPLANT
SYR CONTROL 10ML LL (SYRINGE) ×3 IMPLANT
TOWEL GREEN STERILE FF (TOWEL DISPOSABLE) ×6 IMPLANT
TRAY FAXITRON CT DISP (TRAY / TRAY PROCEDURE) ×2 IMPLANT
TUBE CONNECTING 20'X1/4 (TUBING) ×1
TUBE CONNECTING 20X1/4 (TUBING) ×1 IMPLANT
YANKAUER SUCT BULB TIP NO VENT (SUCTIONS) ×2 IMPLANT

## 2019-10-20 NOTE — Op Note (Signed)
Operative Note  Pre-operative Diagnosis:  Invasive ductal breast carcinoma  Post-operative Diagnosis:  same  Surgeon:  Armandina Gemma, MD  Assistant:  none   Procedure:  Right breast partial mastectomy (upper outer quadrant)  Anesthesia:  general  Estimated Blood Loss:  25 cc  Drains: none         Specimen: partial mastectomy specimen to pathology  Indications:  Patient returns for surgical follow-up having undergone stereotactic core needle biopsy of right breast mass. Initial biopsy had benign cytopathology. However, the area of architectural distortion on the mammogram was concerning enough to warrant a second biopsy performed by Dr. Johnnette Gourd. 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 Tumor dimension appeared to be approximately 1.2 cm. I discussed the case with Dr. Isaiah Blakes as well as with my partner, Dr. Donne Hazel, and the case was presented this morning at the breast cancer conference. Tumor is estrogen receptor positive. This is a T1c lesion. The committee felt that lymph node sampling was not required. Therefore the patient presents today to discuss partial mastectomy in the near future.  Procedure:  The patient was seen in the pre-op holding area. The risks, benefits, complications, treatment options, and expected outcomes were previously discussed with the patient. The patient agreed with the proposed plan and has signed the informed consent form.  The patient was brought to the operating room by the surgical team, identified as Vanessa Shaffer and the procedure verified. A "time out" was completed and the above information confirmed.  Following administration of general anesthesia, the patient is positioned and then prepped and draped in the usual aseptic fashion.  After ascertaining that an adequate level of anesthesia been achieved, a curvilinear incision is made over the upper outer  quadrant of the right breast.  Dissection was carried into the subcutaneous tissues and hemostasis achieved with the electrocautery.  There is a mass present on the anterior surface of the breast in the subcutaneous tissue.  Skin flaps are elevated circumferentially.  Dissection is then carried down to the chest wall.  The entire upper outer quadrant of the breast is dissected out to the level of the low axilla.  This includes the axillary tail of breast tissue.  Larger vessels are divided between medium ligaclips.  The entire specimen is excised.  It is marked with 6 different colors of paint for orientation purposes.  A specimen mammogram was performed in the room which clearly identifies the previous biopsy clips to be well within the specimen in all dimensions.  Margins of the dissection are marked with medium ligaclips circumferentially.  Good hemostasis is noted.  Wound is irrigated with warm saline.  Subcutaneous tissues are closed with interrupted 3-0 Vicryl sutures.  Skin is anesthetized with local anesthetic.  Skin edges are reapproximated with a running 4-0 Vicryl subcuticular suture.  Wound is washed and dried and Dermabond is applied as dressing.  Patient is awakened from anesthesia.  A breast binder is placed.  Patient is transported to the recovery room in stable condition.  The patient tolerated the procedure well.   Armandina Gemma, MD [facility] Office: [phone]

## 2019-10-20 NOTE — Progress Notes (Signed)
Location of Breast Cancer: Right Breast  Histology per Pathology Report: 10/07/19 Diagnosis Breast, right, needle core biopsy, UOQ - INVASIVE DUCTAL CARCINOMA, GRADE I/II. - DUCTAL CARCINOMA IN SITU  Receptor Status: ER(100%), PR (60%), Her2-neu (NEG), Ki-(5%)  10/20/19 FINAL MICROSCOPIC DIAGNOSIS: A. BREAST, RIGHT, PARTIAL MASTECTOMY: - Invasive ductal carcinoma, grade 2, spanning 2.9 cm. 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 - Invasive carcinoma is at the anterior margin focally (on ink) and 0.2 cm from the lateral margin focally (not on ink). - Margins are negative for in situ carcinoma. - Biopsy site. - See oncology table.  Did patient present with symptoms or was this found on screening mammography?: She self palpated the mass herself and brought it to the attention of her PCP. A mammogram was then perfromed.   Past/Anticipated interventions by surgeon, if any: 10/20/19 Dr. Harlow Asa Procedure:  Right breast partial mastectomy (upper outer quadrant)  Past/Anticipated interventions by medical oncology, if any:  11/02/19 Dr. Jana Hakim  Lymphedema issues, if any:   She denies. She has good arm mobility.   Pain issues, if any:  She denies.   SAFETY ISSUES:  Prior radiation? No  Pacemaker/ICD? No  Possible current pregnancy? No  Is the patient on methotrexate? No  Current Complaints / other details:      Deundra Bard, Stephani Police, RN 10/20/2019,3:46 PM

## 2019-10-20 NOTE — Interval H&P Note (Signed)
History and Physical Interval Note:  10/20/2019 10:45 AM  Vanessa Shaffer  has presented today for surgery, with the diagnosis of RIGHT BREAST CARCINOMA.  The various methods of treatment have been discussed with the patient and family. After consideration of risks, benefits and other options for treatment, the patient has consented to    Procedure(s): RIGHT PARTIAL MASTECTOMY (Right) as a surgical intervention.    The patient's history has been reviewed, patient examined, no change in status, stable for surgery.  I have reviewed the patient's chart and labs.  Questions were answered to the patient's satisfaction.    Armandina Gemma, MD [facility] Office: [place]

## 2019-10-20 NOTE — Anesthesia Procedure Notes (Signed)
Procedure Name: LMA Insertion Date/Time: 10/20/2019 11:05 AM Performed by: Gwyndolyn Saxon, CRNA Pre-anesthesia Checklist: Patient identified, Emergency Drugs available, Suction available and Patient being monitored Patient Re-evaluated:Patient Re-evaluated prior to induction Oxygen Delivery Method: Circle system utilized Preoxygenation: Pre-oxygenation with 100% oxygen Induction Type: IV induction Ventilation: Mask ventilation without difficulty LMA: LMA inserted LMA Size: 3.0 Number of attempts: 1 Placement Confirmation: positive ETCO2 and breath sounds checked- equal and bilateral Tube secured with: Tape Dental Injury: Teeth and Oropharynx as per pre-operative assessment

## 2019-10-20 NOTE — Anesthesia Preprocedure Evaluation (Signed)
Anesthesia Evaluation  Patient identified by MRN, date of birth, ID band Patient awake    Reviewed: Allergy & Precautions, NPO status , Patient's Chart, lab work & pertinent test results, reviewed documented beta blocker date and time   Airway Mallampati: II  TM Distance: >3 FB Neck ROM: Full    Dental no notable dental hx.    Pulmonary neg pulmonary ROS,    Pulmonary exam normal breath sounds clear to auscultation       Cardiovascular hypertension, Pt. on home beta blockers Normal cardiovascular exam Rhythm:Regular Rate:Normal     Neuro/Psych negative neurological ROS  negative psych ROS   GI/Hepatic negative GI ROS, Neg liver ROS,   Endo/Other  negative endocrine ROS  Renal/GU negative Renal ROS     Musculoskeletal negative musculoskeletal ROS (+)   Abdominal   Peds  Hematology HLD   Anesthesia Other Findings RIGHT BREAST CARCINOMA  Reproductive/Obstetrics                             Anesthesia Physical Anesthesia Plan  ASA: II  Anesthesia Plan: General   Post-op Pain Management:    Induction: Intravenous  PONV Risk Score and Plan: 3 and Ondansetron, Dexamethasone and Treatment may vary due to age or medical condition  Airway Management Planned: LMA  Additional Equipment:   Intra-op Plan:   Post-operative Plan: Extubation in OR  Informed Consent: I have reviewed the patients History and Physical, chart, labs and discussed the procedure including the risks, benefits and alternatives for the proposed anesthesia with the patient or authorized representative who has indicated his/her understanding and acceptance.     Dental advisory given  Plan Discussed with: CRNA  Anesthesia Plan Comments:         Anesthesia Quick Evaluation

## 2019-10-20 NOTE — Anesthesia Postprocedure Evaluation (Signed)
Anesthesia Post Note  Patient: Vanessa Shaffer  Procedure(s) Performed: RIGHT PARTIAL MASTECTOMY (Right Breast)     Patient location during evaluation: PACU Anesthesia Type: General Level of consciousness: awake and alert Pain management: pain level controlled Vital Signs Assessment: post-procedure vital signs reviewed and stable Respiratory status: spontaneous breathing, nonlabored ventilation, respiratory function stable and patient connected to nasal cannula oxygen Cardiovascular status: blood pressure returned to baseline and stable Postop Assessment: no apparent nausea or vomiting Anesthetic complications: no    Last Vitals:  Vitals:   10/20/19 1245 10/20/19 1300  BP: 108/62 106/66  Pulse: 61 66  Resp: 13 16  Temp:  36.9 C  SpO2: 93% 97%    Last Pain:  Vitals:   10/20/19 1300  TempSrc:   PainSc: 0-No pain                 [place] Landgrebe L Ayianna Darnold

## 2019-10-20 NOTE — Discharge Instructions (Signed)
No Tylenol until 4:15pm, no ibuprofen until 5:45pm    Post Anesthesia Home Care Instructions  Activity: Get plenty of rest for the remainder of the day. A responsible individual must stay with you for 24 hours following the procedure.  For the next 24 hours, DO NOT: -Drive a car -Paediatric nurse -Drink alcoholic beverages -Take any medication unless instructed by your physician -Make any legal decisions or sign important papers.  Meals: Start with liquid foods such as gelatin or soup. Progress to regular foods as tolerated. Avoid greasy, spicy, heavy foods. If nausea and/or vomiting occur, drink only clear liquids until the nausea and/or vomiting subsides. Call your physician if vomiting continues.  Special Instructions/Symptoms: Your throat may feel dry or sore from the anesthesia or the breathing tube placed in your throat during surgery. If this causes discomfort, gargle with warm salt water. The discomfort should disappear within 24 hours.  If you had a scopolamine patch placed behind your ear for the management of post- operative nausea and/or vomiting:  1. The medication in the patch is effective for 72 hours, after which it should be removed.  Wrap patch in a tissue and discard in the trash. Wash hands thoroughly with soap and water. 2. You may remove the patch earlier than 72 hours if you experience unpleasant side effects which may include dry mouth, dizziness or visual disturbances. 3. Avoid touching the patch. Wash your hands with soap and water after contact with the patch.

## 2019-10-20 NOTE — Transfer of Care (Signed)
Immediate Anesthesia Transfer of Care Note  Patient: Vanessa Shaffer  Procedure(s) Performed: RIGHT PARTIAL MASTECTOMY (Right Breast)  Patient Location: PACU  Anesthesia Type:General  Level of Consciousness: awake and patient cooperative  Airway & Oxygen Therapy: Patient Spontanous Breathing and Patient connected to nasal cannula oxygen  Post-op Assessment: Report given to RN and Post -op Vital signs reviewed and stable  Post vital signs: Reviewed and stable  Last Vitals:  Vitals Value Taken Time  BP 117/71 10/20/19 1202  Temp    Pulse 73 10/20/19 1205  Resp 15 10/20/19 1205  SpO2 98 % 10/20/19 1205  Vitals shown include unvalidated device data.  Last Pain:  Vitals:   10/20/19 0940  TempSrc: Oral  PainSc: 0-No pain      Patients Stated Pain Goal: 3 ([id number])  Complications: No apparent anesthesia complications

## 2019-10-21 ENCOUNTER — Encounter: Payer: Self-pay | Admitting: *Deleted

## 2019-10-22 ENCOUNTER — Other Ambulatory Visit: Payer: Self-pay | Admitting: Oncology

## 2019-10-22 ENCOUNTER — Encounter: Payer: Self-pay | Admitting: *Deleted

## 2019-10-22 DIAGNOSIS — C50411 Malignant neoplasm of upper-outer quadrant of right female breast: Secondary | ICD-10-CM

## 2019-10-22 LAB — SURGICAL PATHOLOGY

## 2019-10-22 NOTE — Progress Notes (Signed)
Radiation Oncology         ([phone] ________________________________  Initial outpatient Consultation by telephone as patient was unable to access MyChart video during pandemic precautions  Name: Vanessa Shaffer MRN: [id number]  Date: 10/23/2019  DOB: 16-Mar-1941  DG:LOVFIE, Denton Ar, MD  [name], MD   REFERRING PHYSICIAN: [name], MD  DIAGNOSIS:    ICD-10-CM   1. Malignant neoplasm of upper-outer quadrant of right breast in female, estrogen receptor positive ([place])  C50.411    Z17.0    Cancer Staging Malignant neoplasm of upper-outer quadrant of right breast in female, estrogen receptor positive ([place]) Staging form: Breast, AJCC 8th Edition - Clinical stage from 10/07/2019: Stage IA (cT1c, cN0, cM0, G1, ER+, PR+, HER2-) - Signed by Gardenia Phlegm, NP on 10/14/2019 - Pathologic stage from 10/23/2019: Stage Unknown (pT2, pNX, G2, ER+, PR+, HER2-) - Signed by Eppie Gibson, MD on 10/23/2019   CHIEF COMPLAINT: Here to discuss management of right breast cancer  HISTORY OF PRESENT ILLNESS::Vanessa Shaffer is a 78 y.o. female who presented with breast abnormality on the following imaging: annual mammogram on the date of 07/30/2019.  This was a re-evaluation of a palpable right breast upper-outer quadrant lump originally evaluated in 12/2014.   Ultrasound of breast on 07/30/2019 revealed  2 cm irregular mass in the right breast at 10 o'clock.   Biopsy on date of 09/23/2019 was benign.  However, the area of distortion was concerning enough to warrant a second biopsy.  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  ER status: positive; PR status positive, Her2 status negative; Grade 1-2.  She opted to proceed with right lumpectomy on date of 10/20/2019 with pathology report revealing: tumor size of 2.9 cm; histology of ductal carcinoma; margin status to invasive disease of positive at anterior and margin status to in situ disease of uninvolved; Grade  2. Dr. Harlow Asa does not feel there is more breast tissue to excise at the positive margin - it is immediately under skin.  Lymphedema issues, if any:   She denies. She has good arm mobility.   Pain issues, if any:  She denies.   SAFETY ISSUES:  Prior radiation? No  Pacemaker/ICD? No  Possible current pregnancy? No  Is the patient on methotrexate? 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    PAST SURGICAL HISTORY: Past Surgical History:  Procedure Laterality Date  . BASAL CELL CARCINOMA EXCISION    . BREAST SURGERY  1975   Cyst  . MASTECTOMY, PARTIAL Right 10/20/2019   Procedure: RIGHT PARTIAL MASTECTOMY;  Surgeon: Armandina Gemma, MD;  Location: [facility];  Service: General;  Laterality: Right;  . THYROID SURGERY  1975   Cyst    FAMILY HISTORY: family history includes Colon polyps in her brother and father; Heart disease in her father; Hypertension in her mother.  SOCIAL HISTORY:  reports that she has never smoked. She has never used smokeless tobacco. She reports current alcohol use. She reports that she does not use drugs.  ALLERGIES: Sulfa antibiotics, Terazol [terconazole], Ambien [zolpidem tartrate], and Codeine  MEDICATIONS:  Current Outpatient Medications  Medication Sig Dispense Refill  . acetaminophen (TYLENOL) 500 MG tablet Take 1,000 mg by mouth every 6 (six) hours as needed for moderate pain.    Marland Kitchen aspirin 81 MG tablet Take 81 mg by mouth daily. Takes about  twice weekly    . atorvastatin (LIPITOR) 10 MG tablet Take 10 mg by mouth daily.    . Calcium Carbonate-Vitamin D (CALCIUM + D PO) Take by mouth daily.      Marland Kitchen ibuprofen (ADVIL,MOTRIN) 200 MG tablet Take 400-600 mg by mouth every 6 (six) hours as needed for moderate pain.    . metoprolol succinate (TOPROL-XL) 50 MG 24 hr tablet TAKE 1 TABLET ONCE  DAILY. 30 tablet 1  . NONFORMULARY OR COMPOUNDED ITEM Vaginal estrogen 0.02% cream twice weekly (Patient not taking: Reported on 07/13/2019) 90 each 3  . traMADol (ULTRAM) 50 MG tablet Take 1-2 tablets (50-100 mg total) by mouth every 6 (six) hours as needed for moderate pain. (Patient not taking: Reported on 10/23/2019) 15 tablet 0   No current facility-administered medications for this encounter.    REVIEW OF SYSTEMS: As above   PHYSICAL EXAM:  vitals were not taken for this visit.   General: Alert and oriented, in no acute distress Psychiatric: Judgment and insight are intact. Affect is appropriate.    LABORATORY DATA:  Lab Results  Component Value Date   WBC 6.6 05/28/2014   HGB 13.2 05/28/2014   HCT 39.1 05/28/2014   MCV 93.9 05/28/2014   PLT 216.0 05/28/2014   CMP     Component Value Date/Time   NA 141 10/15/2019 1406   K 3.9 10/15/2019 1406   CL 100 10/15/2019 1406   CO2 29 10/15/2019 1406   GLUCOSE 126 (H) 10/15/2019 1406   BUN 19 10/15/2019 1406   CREATININE 0.94 10/15/2019 1406   CALCIUM 9.4 10/15/2019 1406   GFRNONAA 58 (L) 10/15/2019 1406   GFRAA >60 10/15/2019 1406         RADIOGRAPHY:  As above     IMPRESSION/PLAN: Right Breast Cancer   I'm concerned the axilla was not staged clinically, and I asked radiology to double check that. The patient had a pT2Nx cancer (no SLN bx done). She does not recall getting her axilla examined by [place], just her breast, and the [place] report does not mention axilla.    It was a pleasure meeting the patient today. We discussed the risks, benefits, and side effects of radiotherapy. I recommend radiotherapy to the right breast and axilla with high tangents to reduce her risk of locoregional recurrence by 2/3.  We discussed that radiation would take approximately 4 weeks to complete and that I would give the patient a few weeks to heal following surgery before starting treatment planning.  If chemotherapy were to be given, this would  precede radiotherapy (medical oncology consult pending).  We spoke about acute effects including skin irritation and fatigue as well as much less common late effects including internal organ injury or irritation. We spoke about the latest technology that is used to minimize the risk of late effects for patients undergoing radiotherapy to the breast or chest wall. No guarantees of treatment were given. The patient is enthusiastic about proceeding with treatment. I look forward to participating in the patient's care.   This encounter was provided by telemedicine platform by telephone as patient was unable to access MyChart video during pandemic precautions The patient has given verbal consent for this type of encounter and has been advised to only accept a meeting of this type in a secure network environment. The time spent during this encounter wa50 minutes. The attendants for this meeting include Eppie Gibson  and Delsa Grana.  During the encounter, Eppie Gibson was located at  [facility].  Delsa Grana was located at home.    __________________________________________   Eppie Gibson, MD   This document serves as a record of services personally performed by Eppie Gibson, MD. It was created on her behalf by Wilburn Mylar, a trained medical scribe. The creation of this record is based on the scribe's personal observations and the provider's statements to them. This document has been checked and approved by the attending provider.

## 2019-10-23 ENCOUNTER — Other Ambulatory Visit: Payer: Self-pay

## 2019-10-23 ENCOUNTER — Ambulatory Visit
Admission: RE | Admit: 2019-10-23 | Discharge: 2019-10-23 | Disposition: A | Discharge status: Home / Self Care | Payer: Medicare Other | Source: Ambulatory Visit | Attending: Radiation Oncology | Admitting: Radiation Oncology

## 2019-10-23 ENCOUNTER — Encounter: Payer: Self-pay | Admitting: Radiation Oncology

## 2019-10-23 DIAGNOSIS — Z9889 Other specified postprocedural states: Secondary | ICD-10-CM | POA: Diagnosis not present

## 2019-10-23 DIAGNOSIS — Z170 Estrogen receptor positive status [ER+]: Secondary | ICD-10-CM

## 2019-10-23 DIAGNOSIS — C50411 Malignant neoplasm of upper-outer quadrant of right female breast: Secondary | ICD-10-CM | POA: Diagnosis not present

## 2019-10-23 NOTE — Progress Notes (Signed)
Dr. Harlow Asa has commented on the margin issue:  While the pathologist is calling positive and close margins, there is really not any additional tissue to take. The anterior margin would be subcutaneous tissue, of which little was left. The lateral margin likewise is abutted by subcutaneous adipose tissue of which little was left. I therefore do not think re-excision is an option. I guess we could ultrasound to see if there is any sign of residual breast tissue anteriorly or laterally that could be removed.

## 2019-10-26 ENCOUNTER — Telehealth: Payer: Self-pay

## 2019-10-26 ENCOUNTER — Telehealth: Payer: Self-pay | Admitting: *Deleted

## 2019-10-26 NOTE — Telephone Encounter (Signed)
Received order per Dr. Jana Hakim for oncotype testing. Requisition faxed to pathology and [facility]

## 2019-10-26 NOTE — Telephone Encounter (Signed)
I called Vanessa Shaffer to inform her per Dr. Pearlie Oyster request that her treatment team have discussed that she does not need anymore imaging of her breasts or axilla. She is aware that Dr. Jana Hakim will see her on 11/02/19 for consult and he will refer her back to Dr. Isidore Moos when she is ready for radiation. She voiced her appreciation for the phone call. She knows that I am available if she has any further questions.

## 2019-10-28 DIAGNOSIS — H25813 Combined forms of age-related cataract, bilateral: Secondary | ICD-10-CM | POA: Diagnosis not present

## 2019-10-28 DIAGNOSIS — H52223 Regular astigmatism, bilateral: Secondary | ICD-10-CM | POA: Diagnosis not present

## 2019-10-28 DIAGNOSIS — H524 Presbyopia: Secondary | ICD-10-CM | POA: Diagnosis not present

## 2019-10-28 DIAGNOSIS — H5213 Myopia, bilateral: Secondary | ICD-10-CM | POA: Diagnosis not present

## 2019-10-28 DIAGNOSIS — H25819 Combined forms of age-related cataract, unspecified eye: Secondary | ICD-10-CM | POA: Diagnosis not present

## 2019-10-30 ENCOUNTER — Encounter: Payer: Self-pay | Admitting: Oncology

## 2019-10-30 NOTE — Progress Notes (Signed)
[place]  Telephone:([phone]) [phone] Fax:(336) [phone]     ID: Vanessa Shaffer DOB: 02/10/1941  MR#: [id number]  OHY#:[id number]  Patient Care Team: Vanessa Low, MD as PCP - General (Internal Medicine) Vanessa Gemma, MD as Consulting Physician (General Surgery) Vanessa Kaufmann, RN as Oncology Nurse Navigator Vanessa Germany, RN as Oncology Nurse Navigator Vanessa Gibson, MD as Consulting Physician (Radiation Oncology) Shaffer, Vanessa Dad, MD as Consulting Physician (Oncology) Vanessa Bruins, MD as Consulting Physician (Obstetrics and Gynecology) [facility], Vanessa Bravo, MD as Referring Physician (Dermatology) Vanessa Lobo, MD as Consulting Physician (Gastroenterology) Vanessa Shaffer, OD (Optometry) Vanessa Cruel, MD OTHER MD:  CHIEF COMPLAINT: Estrogen receptor positive breast cancer  CURRENT TREATMENT: Adjuvant radiation pending   HISTORY OF CURRENT ILLNESS: Vanessa Shaffer initially noted a palpable upper-outer right breast lump in 12/2014. Nothing was seen on imaging at the time--we reviewed her mammograms and ultrasounds back to 2015 today--and she continued on annual mammography as scheduled until 07/30/2019.  At that time she underwent routine bilateral diagnostic mammography with tomography and right breast ultrasonography at [facility] showing: breast density category B; 2 cm irregular mass in the right breast at 10 o'clock. Right axilla was imaged by ultrasound on 07/30/2019 and was normal (information per Dr. Gareth Shaffer email dated 10/24/2019).  Accordingly on 09/23/2019 Vanessa Shaffer proceeded to biopsy of the right breast area in question. The pathology from this procedure ([id number]) showed: benign breast tissue.  This was felt to be discordant and repeat biopsy was performed on 10/07/2019 showing ([id number]): invasive ductal carcinoma, grade 1 or 2; ductal carcinoma in situ. Prognostic indicators significant for: estrogen receptor, 100% positive with strong  staining intensity, and progesterone receptor, 60% positive with weak staining intensity. Proliferation marker Ki67 at 5%. HER2 negative by immunohistochemistry (1+).  She opted to proceed with right lumpectomy on 10/20/2019 under Dr. Harlow Shaffer. 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   Dr. Harlow Shaffer has commented on the margin issue: "While the pathologist is calling positive and close margins, there is really not any additional tissue to take. The anterior margin would be subcutaneous tissue, of which little was left. The lateral margin likewise is abutted by subcutaneous adipose tissue of which little was left. I therefore do not think re-excision is an option. I guess we could ultrasound to see if there is any sign of residual breast tissue anteriorly or laterally that could be removed."  The case was reviewed radiologically by Dr. Isaiah Shaffer and on her 10/24/2019 email she states "I am confirming my opinion that if both clips were well located on specimen radiograph that no additional imaging would help [place] with margin status."  The patient's subsequent history is as detailed below.   INTERVAL HISTORY: Vanessa Shaffer was evaluated in the breast cancer clinic on 11/02/2019 with her daughter Vanessa Shaffer participating by speaker phone.  Her case was also presented at the multidisciplinary breast cancer conference 10/14/2019.  At that time a preliminary plan was suggested: breast conserving surgery with no sentinel lymph node removal given patient's age and tumor phenotype, to minimize the risk of lymphedema; adjuvant radiation, adjuvant antiestrogens.  REVIEW OF SYSTEMS: Vanessa Shaffer denies unusual headaches, visual changes, nausea, vomiting, stiff neck, dizziness, or gait imbalance. There has been no cough, phlegm production, or pleurisy, no chest  pain or pressure, and no change in bowel or bladder habits. The patient denies fever, rash,  bleeding, unexplained fatigue or unexplained weight loss.  She exercises regularly by playing golf and also at the gym.  A detailed review of systems was otherwise entirely negative.   PAST MEDICAL HISTORY: Past Medical History:  Diagnosis Date  . Breast cancer ([place])   . 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 Elevated cholesterol   . Hypertension   . Leiomyoma   . 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 BASAL CELL CARCINOMA EXCISION    . BREAST SURGERY  1975   Cyst  . MASTECTOMY, PARTIAL Right 10/20/2019   Procedure: RIGHT PARTIAL MASTECTOMY;  Surgeon: Vanessa Gemma, MD;  Location: [facility];  Service: General;  Laterality: Right;  . THYROID SURGERY  1975   Cyst    FAMILY HISTORY: Family History  Problem Relation Age of Onset  . Hypertension Mother   . Heart disease Father   . Colon polyps Father   . Colon polyps Brother    The patient's father died from congestive heart failure in his late 65s.  The patient's mother died at age 46.  The family is of Ashkenazi descent.  The maternal grandmother had "blood cancer".  There are however no other cancers in the family to the patient's knowledge   GYNECOLOGIC HISTORY:  No LMP recorded. Patient is postmenopausal. Menarche: 79 years old Age at first live birth: 79 years old [place] P 2 LMP late 25s Contraceptive: Oral contraceptives for some years with no complications HRT "a few years"  Hysterectomy? no BSO? 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  Her husband Vanessa Shaffer is an attorney dealing with several cases.  Daughter Vanessa Shaffer,, 73, is a Secondary school teacher in [place]; daughter Vanessa Shaffer is a Pharmacist, hospital in [place] (her husband is an Magazine features editor at [employer]).  The patient has 4 grandchildren, one in  [place] and 3 in [place].  She attends [employer]  ADVANCED DIRECTIVES: In the absence of any documents to the contrary the patient's husband is her healthcare power of attorney   HEALTH MAINTENANCE: Social History   Tobacco Use  . Smoking status: Never Smoker  . Smokeless tobacco: Never Used  Substance Use Topics  . Alcohol use: Yes    Alcohol/week: 0.0 standard drinks    Comment: rare  . Drug use: No     Colonoscopy: 2019/[facility]  PAP: 07/2019, negative  Bone density: 07/2018, -2.7   Allergies  Allergen Reactions  . Sulfa Antibiotics Hives  . Terazol [Terconazole] Itching  . Ambien [Zolpidem Tartrate] Palpitations  . Codeine Palpitations    Current Outpatient Medications  Medication Sig Dispense Refill  . acetaminophen (TYLENOL) 500 MG tablet Take 1,000 mg by mouth every 6 (six) hours as needed for moderate pain.    Marland Kitchen aspirin 81 MG tablet Take 81 mg by mouth daily. Takes about twice weekly    . atorvastatin (LIPITOR) 10 MG tablet Take 10 mg by mouth daily.    . Calcium Carbonate-Vitamin D (CALCIUM + D PO) Take by mouth daily.      Marland Kitchen ibuprofen (ADVIL,MOTRIN) 200 MG tablet Take 400-600 mg by mouth every 6 (six) hours as needed for moderate pain.    . metoprolol succinate (TOPROL-XL) 50 MG 24 hr tablet TAKE 1 TABLET ONCE DAILY. 30 tablet 1   No current facility-administered medications for this visit.    OBJECTIVE: Middle-aged white  woman who appears younger than stated age  9:   11/02/19 1543  BP: (!) 141/78  Pulse: 65  Resp: 18  Temp: 98.4 F (36.9 C)  SpO2: 100%     Body mass index is 20 kg/m.   Wt Readings from Last 3 Encounters:  11/02/19 116 lb 8 oz (52.8 kg)  10/20/19 115 lb 1.3 oz (52.2 kg)  07/13/19 115 lb (52.2 kg)      ECOG FS:1 - Symptomatic but completely ambulatory  Ocular: Sclerae unicteric, pupils round and equal Ear-nose-throat: Wearing a mask Lymphatic: No cervical or supraclavicular adenopathy Lungs no rales or rhonchi  Heart regular rate and rhythm Abd soft, nontender, positive bowel sounds MSK no focal spinal tenderness, no joint edema Neuro: non-focal, well-oriented, appropriate affect Breasts: The right breast is status post recent lumpectomy.  The incision is healing well, without dehiscence, erythema, or swelling.  The left breast is benign.  Both axillae are benign.   LAB RESULTS:  CMP     Component Value Date/Time   NA 141 11/02/2019 1531   K 4.0 11/02/2019 1531   CL 104 11/02/2019 1531   CO2 28 11/02/2019 1531   GLUCOSE 124 (H) 11/02/2019 1531   BUN 21 11/02/2019 1531   CREATININE 0.92 11/02/2019 1531   CALCIUM 9.0 11/02/2019 1531   PROT 7.0 11/02/2019 1531   ALBUMIN 3.9 11/02/2019 1531   AST 15 11/02/2019 1531   ALT 17 11/02/2019 1531   ALKPHOS 85 11/02/2019 1531   BILITOT 0.3 11/02/2019 1531   GFRNONAA 60 (L) 11/02/2019 1531   GFRAA >60 11/02/2019 1531    No results found for: TOTALPROTELP, ALBUMINELP, A1GS, A2GS, BETS, BETA2SER, GAMS, MSPIKE, SPEI  Lab Results  Component Value Date   WBC 16.3 (H) 11/02/2019   NEUTROABS 5.5 11/02/2019   HGB 12.8 11/02/2019   HCT 39.1 11/02/2019   MCV 97.8 11/02/2019   PLT 218 11/02/2019    No results found for: LABCA2  No components found for: VQMGQQ761  No results for input(s): INR in the last 168 hours.  No results found for: LABCA2  No results found for: PJK932  No results found for: IZT245  No results found for: YKD983  No results found for: CA2729  No components found for: HGQUANT  No results found for: CEA1 / No results found for: CEA1   No results found for: AFPTUMOR  No results found for: CHROMOGRNA  No results found for: KPAFRELGTCHN, LAMBDASER, KAPLAMBRATIO (kappa/lambda light chains)  No results found for: HGBA, HGBA2QUANT, HGBFQUANT, HGBSQUAN (Hemoglobinopathy evaluation)   No results found for: LDH  No results found for: IRON, TIBC, IRONPCTSAT (Iron and TIBC)  No results found for: FERRITIN   Urinalysis    Component Value Date/Time   COLORURINE YELLOW 12/15/2014 1037   APPEARANCEUR CLEAR 12/15/2014 1037   LABSPEC 1.013 12/15/2014 1037   PHURINE 6.5 12/15/2014 1037   GLUCOSEU NEG 12/15/2014 1037   HGBUR NEG 12/15/2014 1037   BILIRUBINUR NEG 12/15/2014 1037   KETONESUR NEG 12/15/2014 1037   PROTEINUR NEG 12/15/2014 1037   UROBILINOGEN 0.2 12/15/2014 1037   NITRITE NEG 12/15/2014 1037   LEUKOCYTESUR NEG 12/15/2014 1037     STUDIES: No results found.   ELIGIBLE FOR AVAILABLE RESEARCH PROTOCOL: AET (at start of antiestrogens).  ASSESSMENT: 79 y.o. [place] woman status post right breast upper outer quadrant biopsy 10/07/2019 for a clinical T1c N0 invasive ductal carcinoma, grade 1 or 2, estrogen and progesterone receptor positive, HER-2 not amplified, with an MIB-1 of  5%  (1) status post right lumpectomy without sentinel lymph node sampling 10/20/2019 for a T2 NX, stage IB invasive ductal carcinoma, grade 2, with a focally positive anterior margin  (a) no residual breast tissue at anterior margin; no further surgery indicated  (2) Oncotype score pending: Chemotherapy not anticipated  (3) adjuvant radiation (4 weeks) to follow  (4) antiestrogens to start at the completion of local therapy  PLAN: I met today with Vanessa Shaffer to review her new diagnosis. Specifically we discussed the biology of her breast cancer, its diagnosis, staging, treatment  options and prognosis. We first reviewed the fact that cancer is not one disease but more than 100 different diseases and that it is important to keep them separate-- otherwise when friends and relatives discuss their own cancer experiences with Vanessa Shaffer confusion can result. Similarly we explained that if breast cancer spreads to the bone or liver, the patient would not have bone cancer or liver cancer, but breast cancer in the bone and breast cancer in the liver: one cancer in three places-- not 3 different cancers which otherwise would  have to be treated in 3 different ways.  We discussed the difference between local and systemic therapy. In terms of loco-regional treatment, lumpectomy plus radiation is equivalent to mastectomy as far as survival is concerned.  She has had her surgery and adjuvant radiation is pending  We then discussed the rationale for systemic therapy. There is some risk that this cancer may have already spread to other parts of her body.  The risk is Shaffer in stage I patients, but not 0.  Patients frequently ask at this point about bone scans, CAT scans and PET scans to find out if they have occult breast cancer somewhere else. The problem is that in early stage disease we are much more likely to find false positives then true cancers and this would expose the patient to unnecessary procedures as well as unnecessary radiation. Scans cannot answer the question the patient really would like to know, which is whether she has microscopic disease elsewhere in her body. For those reasons scans are not recommended in this setting.  Of course we would proceed to aggressive evaluation of any symptoms that might suggest metastatic disease, but that is not the case here.  Next we went over the options for systemic therapy which are anti-estrogens, anti-HER-2 immunotherapy, and chemotherapy. Vanessa Shaffer does not meet criteria for anti-HER-2 immunotherapy. She is a good candidate for anti-estrogens.  The question of chemotherapy is more complicated. Chemotherapy is most effective in rapidly growing, aggressive tumors. It is much less effective in not-high-grade, slow growing cancers, like Vanessa Shaffer 's. For that reason I anticipate that she will have a good prognosis without chemotherapy and that chemotherapy would not make her good prognosis better.  We will confirm this with an Oncotype from the definitive surgical sample, which is currently pending   The plan then, assuming the Oncotype comes back Shaffer risk as expected, would be to  follow-up with radiation and then see me likely early May to discuss antiestrogens which would be continued for 5 years.  Vanessa Shaffer does qualify for genetics testing. In patients who carry a deleterious mutation [for example in a  BRCA gene], the risk of a new breast cancer developing in the future may be sufficiently great that the patient may choose bilateral mastectomies. However if she wishes to keep her breasts in that situation it is safe to do so. That would require intensified screening, which generally means not  only yearly mammography but a yearly breast MRI as well.  I have placed a genetics referral and anticipate the blood test can be obtained at the time she returns for radiation treatments.  Vanessa Shaffer has a good understanding of the overall plan. She agrees with it. She knows the goal of treatment in her case is cure. She will call with any problems that may develop before her next visit here.  Total encounter time 65 minutes.Vanessa Cruel, MD   11/02/2019 5:18 PM Medical Oncology and Hematology [facility] [place], [place] [address] Tel. [phone]    Fax. [phone]   This document serves as a record of services personally performed by Lurline Del, MD. It was created on his behalf by Wilburn Mylar, a trained medical scribe. The creation of this record is based on the scribe's personal observations and the provider's statements to them.   I, Lurline Del MD, have reviewed the above documentation for accuracy and completeness, and I agree with the above.    *Total Encounter Time as defined by the Centers for Medicare and Medicaid Services includes, in addition to the face-to-face time of a patient visit (documented in the note above) non-face-to-face time: obtaining and reviewing outside history, ordering and reviewing medications, tests or procedures, care coordination (communications with other health care professionals or caregivers)  and documentation in the medical record.

## 2019-11-02 ENCOUNTER — Inpatient Hospital Stay: Payer: Medicare Other | Attending: Oncology | Admitting: Oncology

## 2019-11-02 ENCOUNTER — Other Ambulatory Visit: Payer: Self-pay

## 2019-11-02 ENCOUNTER — Inpatient Hospital Stay: Payer: Medicare Other

## 2019-11-02 VITALS — BP 141/78 | HR 65 | Temp 98.4°F | Resp 18 | Ht 64.0 in | Wt 116.5 lb

## 2019-11-02 DIAGNOSIS — Z79899 Other long term (current) drug therapy: Secondary | ICD-10-CM | POA: Insufficient documentation

## 2019-11-02 DIAGNOSIS — Z85828 Personal history of other malignant neoplasm of skin: Secondary | ICD-10-CM

## 2019-11-02 DIAGNOSIS — Z8249 Family history of ischemic heart disease and other diseases of the circulatory system: Secondary | ICD-10-CM | POA: Insufficient documentation

## 2019-11-02 DIAGNOSIS — Z888 Allergy status to other drugs, medicaments and biological substances status: Secondary | ICD-10-CM

## 2019-11-02 DIAGNOSIS — Z8371 Family history of colonic polyps: Secondary | ICD-10-CM | POA: Insufficient documentation

## 2019-11-02 DIAGNOSIS — E7800 Pure hypercholesterolemia, unspecified: Secondary | ICD-10-CM | POA: Diagnosis not present

## 2019-11-02 DIAGNOSIS — I10 Essential (primary) hypertension: Secondary | ICD-10-CM | POA: Diagnosis not present

## 2019-11-02 DIAGNOSIS — Z885 Allergy status to narcotic agent status: Secondary | ICD-10-CM | POA: Diagnosis not present

## 2019-11-02 DIAGNOSIS — Z882 Allergy status to sulfonamides status: Secondary | ICD-10-CM

## 2019-11-02 DIAGNOSIS — C50411 Malignant neoplasm of upper-outer quadrant of right female breast: Secondary | ICD-10-CM

## 2019-11-02 DIAGNOSIS — Z170 Estrogen receptor positive status [ER+]: Secondary | ICD-10-CM | POA: Insufficient documentation

## 2019-11-02 DIAGNOSIS — I498 Other specified cardiac arrhythmias: Secondary | ICD-10-CM

## 2019-11-02 LAB — COMPREHENSIVE METABOLIC PANEL
ALT: 17 U/L (ref 0–44)
AST: 15 U/L (ref 15–41)
Albumin: 3.9 g/dL (ref 3.5–5.0)
Alkaline Phosphatase: 85 U/L (ref 38–126)
Anion gap: 9 (ref 5–15)
BUN: 21 mg/dL (ref 8–23)
CO2: 28 mmol/L (ref 22–32)
Calcium: 9 mg/dL (ref 8.9–10.3)
Chloride: 104 mmol/L (ref 98–111)
Creatinine, Ser: 0.92 mg/dL (ref 0.44–1.00)
GFR calc Af Amer: >60 mL/min (ref >60)
GFR calc non Af Amer: 60 mL/min — ABNORMAL LOW (ref >60)
Glucose, Bld: 124 mg/dL — ABNORMAL HIGH (ref 70–99)
Potassium: 4 mmol/L (ref 3.5–5.1)
Sodium: 141 mmol/L (ref 135–145)
Total Bilirubin: 0.3 mg/dL (ref 0.3–1.2)
Total Protein: 7 g/dL (ref 6.5–8.1)

## 2019-11-02 LAB — CBC WITH DIFFERENTIAL/PLATELET
Abs Immature Granulocytes: 0.03 10*3/uL (ref 0.00–0.07)
Basophils Absolute: 0.1 10*3/uL (ref 0.0–0.1)
Basophils Relative: 1 %
Eosinophils Absolute: 0.2 10*3/uL (ref 0.0–0.5)
Eosinophils Relative: 1 %
HCT: 39.1 % (ref 36.0–46.0)
Hemoglobin: 12.8 g/dL (ref 12.0–15.0)
Immature Granulocytes: 0 %
Lymphocytes Relative: 58 %
Lymphs Abs: 9.6 10*3/uL — ABNORMAL HIGH (ref 0.7–4.0)
MCH: 32 pg (ref 26.0–34.0)
MCHC: 32.7 g/dL (ref 30.0–36.0)
MCV: 97.8 fL (ref 80.0–100.0)
Monocytes Absolute: 1 10*3/uL (ref 0.1–1.0)
Monocytes Relative: 6 %
Neutro Abs: 5.5 10*3/uL (ref 1.7–7.7)
Neutrophils Relative %: 34 %
Platelets: 218 10*3/uL (ref 150–400)
RBC: 4 MIL/uL (ref 3.87–5.11)
RDW: 13.6 % (ref 11.5–15.5)
WBC: 16.3 10*3/uL — ABNORMAL HIGH (ref 4.0–10.5)
nRBC: 0 % (ref 0.0–0.2)

## 2019-11-03 ENCOUNTER — Telehealth: Payer: Self-pay | Admitting: Oncology

## 2019-11-03 NOTE — Telephone Encounter (Signed)
I talk with patient regarding video visit °

## 2019-11-06 ENCOUNTER — Other Ambulatory Visit: Payer: Self-pay | Admitting: Oncology

## 2019-11-09 ENCOUNTER — Ambulatory Visit
Admission: RE | Admit: 2019-11-09 | Discharge: 2019-11-09 | Disposition: A | Discharge status: Home / Self Care | Payer: Medicare Other | Source: Ambulatory Visit | Attending: Radiation Oncology | Admitting: Radiation Oncology

## 2019-11-09 ENCOUNTER — Encounter: Payer: Self-pay | Admitting: *Deleted

## 2019-11-09 ENCOUNTER — Other Ambulatory Visit: Payer: Self-pay

## 2019-11-09 DIAGNOSIS — Z510 Encounter for antineoplastic radiation therapy: Secondary | ICD-10-CM | POA: Insufficient documentation

## 2019-11-09 DIAGNOSIS — C50411 Malignant neoplasm of upper-outer quadrant of right female breast: Secondary | ICD-10-CM | POA: Insufficient documentation

## 2019-11-09 DIAGNOSIS — Z170 Estrogen receptor positive status [ER+]: Secondary | ICD-10-CM | POA: Insufficient documentation

## 2019-11-11 ENCOUNTER — Telehealth: Payer: Self-pay | Admitting: Genetic Counselor

## 2019-11-11 ENCOUNTER — Telehealth: Payer: Self-pay | Admitting: *Deleted

## 2019-11-11 NOTE — Telephone Encounter (Signed)
Left a msg for pt to call back per 3/3 sch msg to reschedule appt

## 2019-11-11 NOTE — Telephone Encounter (Signed)
Received oncotype results of 19/6%. Left message on voicemail for a return phone call. Patient is already scheduled to start xrt 3/8

## 2019-11-12 ENCOUNTER — Telehealth: Payer: Self-pay | Admitting: *Deleted

## 2019-11-12 ENCOUNTER — Telehealth: Payer: Self-pay | Admitting: Genetic Counselor

## 2019-11-12 ENCOUNTER — Encounter (HOSPITAL_COMMUNITY): Payer: Self-pay | Admitting: Oncology

## 2019-11-12 ENCOUNTER — Inpatient Hospital Stay: Payer: Medicare Other | Admitting: Genetic Counselor

## 2019-11-12 NOTE — Telephone Encounter (Signed)
Rescheduled Ms. Meissner's genetic counseling appointment for 11/25/19 at 1pm with Roma Kayser. This will need to be a walk-in appointment since she does not have access to a video camera with microphone.

## 2019-11-12 NOTE — Telephone Encounter (Signed)
Received call back from patient and gave her the results of her oncoytpe.  She is glad to hear that she does not need chemo.  She will proceed with xrt.

## 2019-11-16 ENCOUNTER — Ambulatory Visit
Admission: RE | Admit: 2019-11-16 | Discharge: 2019-11-16 | Disposition: A | Discharge status: Home / Self Care | Payer: Medicare Other | Source: Ambulatory Visit | Attending: Radiation Oncology | Admitting: Radiation Oncology

## 2019-11-16 ENCOUNTER — Other Ambulatory Visit: Payer: Self-pay

## 2019-11-16 DIAGNOSIS — Z510 Encounter for antineoplastic radiation therapy: Secondary | ICD-10-CM | POA: Diagnosis not present

## 2019-11-16 DIAGNOSIS — C50411 Malignant neoplasm of upper-outer quadrant of right female breast: Secondary | ICD-10-CM

## 2019-11-16 DIAGNOSIS — Z170 Estrogen receptor positive status [ER+]: Secondary | ICD-10-CM | POA: Diagnosis not present

## 2019-11-16 MED ORDER — SONAFINE EX EMUL
1.0000 "application " | Freq: Once | CUTANEOUS | Status: AC
  Administered 2019-11-16: 1 via TOPICAL

## 2019-11-16 MED ORDER — ALRA NON-METALLIC DEODORANT (RAD-ONC)
1.0000 "application " | Freq: Once | TOPICAL | Status: AC
  Administered 2019-11-16: 1 via TOPICAL

## 2019-11-16 NOTE — Progress Notes (Signed)

## 2019-11-17 ENCOUNTER — Ambulatory Visit
Admission: RE | Admit: 2019-11-17 | Discharge: 2019-11-17 | Disposition: A | Discharge status: Home / Self Care | Payer: Medicare Other | Source: Ambulatory Visit | Attending: Radiation Oncology | Admitting: Radiation Oncology

## 2019-11-17 ENCOUNTER — Other Ambulatory Visit: Payer: Self-pay

## 2019-11-17 DIAGNOSIS — Z510 Encounter for antineoplastic radiation therapy: Secondary | ICD-10-CM | POA: Diagnosis not present

## 2019-11-17 DIAGNOSIS — Z170 Estrogen receptor positive status [ER+]: Secondary | ICD-10-CM | POA: Diagnosis not present

## 2019-11-17 DIAGNOSIS — C50411 Malignant neoplasm of upper-outer quadrant of right female breast: Secondary | ICD-10-CM | POA: Diagnosis not present

## 2019-11-18 ENCOUNTER — Other Ambulatory Visit: Payer: Self-pay

## 2019-11-18 ENCOUNTER — Ambulatory Visit
Admission: RE | Admit: 2019-11-18 | Discharge: 2019-11-18 | Disposition: A | Discharge status: Home / Self Care | Payer: Medicare Other | Source: Ambulatory Visit | Attending: Radiation Oncology | Admitting: Radiation Oncology

## 2019-11-18 DIAGNOSIS — Z170 Estrogen receptor positive status [ER+]: Secondary | ICD-10-CM | POA: Diagnosis not present

## 2019-11-18 DIAGNOSIS — C50411 Malignant neoplasm of upper-outer quadrant of right female breast: Secondary | ICD-10-CM | POA: Diagnosis not present

## 2019-11-18 DIAGNOSIS — Z510 Encounter for antineoplastic radiation therapy: Secondary | ICD-10-CM | POA: Diagnosis not present

## 2019-11-19 ENCOUNTER — Other Ambulatory Visit: Payer: Self-pay

## 2019-11-19 ENCOUNTER — Ambulatory Visit
Admission: RE | Admit: 2019-11-19 | Discharge: 2019-11-19 | Disposition: A | Discharge status: Home / Self Care | Payer: Medicare Other | Source: Ambulatory Visit | Attending: Radiation Oncology | Admitting: Radiation Oncology

## 2019-11-19 DIAGNOSIS — C50411 Malignant neoplasm of upper-outer quadrant of right female breast: Secondary | ICD-10-CM | POA: Diagnosis not present

## 2019-11-19 DIAGNOSIS — Z170 Estrogen receptor positive status [ER+]: Secondary | ICD-10-CM | POA: Diagnosis not present

## 2019-11-19 DIAGNOSIS — Z510 Encounter for antineoplastic radiation therapy: Secondary | ICD-10-CM | POA: Diagnosis not present

## 2019-11-20 ENCOUNTER — Other Ambulatory Visit: Payer: Self-pay

## 2019-11-20 ENCOUNTER — Ambulatory Visit
Admission: RE | Admit: 2019-11-20 | Discharge: 2019-11-20 | Disposition: A | Discharge status: Home / Self Care | Payer: Medicare Other | Source: Ambulatory Visit | Attending: Radiation Oncology | Admitting: Radiation Oncology

## 2019-11-20 DIAGNOSIS — C50411 Malignant neoplasm of upper-outer quadrant of right female breast: Secondary | ICD-10-CM | POA: Diagnosis not present

## 2019-11-20 DIAGNOSIS — Z170 Estrogen receptor positive status [ER+]: Secondary | ICD-10-CM | POA: Diagnosis not present

## 2019-11-20 DIAGNOSIS — Z510 Encounter for antineoplastic radiation therapy: Secondary | ICD-10-CM | POA: Diagnosis not present

## 2019-11-23 ENCOUNTER — Ambulatory Visit
Admission: RE | Admit: 2019-11-23 | Discharge: 2019-11-23 | Disposition: A | Discharge status: Home / Self Care | Payer: Medicare Other | Source: Ambulatory Visit | Attending: Radiation Oncology | Admitting: Radiation Oncology

## 2019-11-23 ENCOUNTER — Other Ambulatory Visit: Payer: Self-pay

## 2019-11-23 DIAGNOSIS — Z510 Encounter for antineoplastic radiation therapy: Secondary | ICD-10-CM | POA: Diagnosis not present

## 2019-11-23 DIAGNOSIS — Z170 Estrogen receptor positive status [ER+]: Secondary | ICD-10-CM | POA: Diagnosis not present

## 2019-11-23 DIAGNOSIS — H00022 Hordeolum internum right lower eyelid: Secondary | ICD-10-CM | POA: Diagnosis not present

## 2019-11-23 DIAGNOSIS — C50411 Malignant neoplasm of upper-outer quadrant of right female breast: Secondary | ICD-10-CM | POA: Diagnosis not present

## 2019-11-24 ENCOUNTER — Ambulatory Visit
Admission: RE | Admit: 2019-11-24 | Discharge: 2019-11-24 | Disposition: A | Discharge status: Home / Self Care | Payer: Medicare Other | Source: Ambulatory Visit | Attending: Radiation Oncology | Admitting: Radiation Oncology

## 2019-11-24 ENCOUNTER — Other Ambulatory Visit: Payer: Self-pay

## 2019-11-24 DIAGNOSIS — Z170 Estrogen receptor positive status [ER+]: Secondary | ICD-10-CM | POA: Diagnosis not present

## 2019-11-24 DIAGNOSIS — Z510 Encounter for antineoplastic radiation therapy: Secondary | ICD-10-CM | POA: Diagnosis not present

## 2019-11-24 DIAGNOSIS — C50411 Malignant neoplasm of upper-outer quadrant of right female breast: Secondary | ICD-10-CM | POA: Diagnosis not present

## 2019-11-25 ENCOUNTER — Ambulatory Visit
Admission: RE | Admit: 2019-11-25 | Discharge: 2019-11-25 | Disposition: A | Discharge status: Home / Self Care | Payer: Medicare Other | Source: Ambulatory Visit | Attending: Radiation Oncology | Admitting: Radiation Oncology

## 2019-11-25 ENCOUNTER — Other Ambulatory Visit: Payer: Self-pay

## 2019-11-25 ENCOUNTER — Inpatient Hospital Stay: Payer: Medicare Other | Attending: Oncology | Admitting: Genetic Counselor

## 2019-11-25 DIAGNOSIS — C50411 Malignant neoplasm of upper-outer quadrant of right female breast: Secondary | ICD-10-CM | POA: Diagnosis not present

## 2019-11-25 DIAGNOSIS — Z170 Estrogen receptor positive status [ER+]: Secondary | ICD-10-CM | POA: Diagnosis not present

## 2019-11-25 DIAGNOSIS — Z510 Encounter for antineoplastic radiation therapy: Secondary | ICD-10-CM | POA: Diagnosis not present

## 2019-11-26 ENCOUNTER — Ambulatory Visit
Admission: RE | Admit: 2019-11-26 | Discharge: 2019-11-26 | Disposition: A | Discharge status: Home / Self Care | Payer: Medicare Other | Source: Ambulatory Visit | Attending: Radiation Oncology | Admitting: Radiation Oncology

## 2019-11-26 ENCOUNTER — Other Ambulatory Visit: Payer: Self-pay

## 2019-11-26 DIAGNOSIS — Z170 Estrogen receptor positive status [ER+]: Secondary | ICD-10-CM | POA: Diagnosis not present

## 2019-11-26 DIAGNOSIS — Z510 Encounter for antineoplastic radiation therapy: Secondary | ICD-10-CM | POA: Diagnosis not present

## 2019-11-26 DIAGNOSIS — C50411 Malignant neoplasm of upper-outer quadrant of right female breast: Secondary | ICD-10-CM | POA: Diagnosis not present

## 2019-11-27 ENCOUNTER — Ambulatory Visit
Admission: RE | Admit: 2019-11-27 | Discharge: 2019-11-27 | Disposition: A | Discharge status: Home / Self Care | Payer: Medicare Other | Source: Ambulatory Visit | Attending: Radiation Oncology | Admitting: Radiation Oncology

## 2019-11-27 ENCOUNTER — Other Ambulatory Visit: Payer: Self-pay

## 2019-11-27 DIAGNOSIS — Z510 Encounter for antineoplastic radiation therapy: Secondary | ICD-10-CM | POA: Diagnosis not present

## 2019-11-27 DIAGNOSIS — Z170 Estrogen receptor positive status [ER+]: Secondary | ICD-10-CM | POA: Diagnosis not present

## 2019-11-27 DIAGNOSIS — C50411 Malignant neoplasm of upper-outer quadrant of right female breast: Secondary | ICD-10-CM | POA: Diagnosis not present

## 2019-11-30 ENCOUNTER — Ambulatory Visit: Payer: Medicare Other | Admitting: Radiation Oncology

## 2019-11-30 ENCOUNTER — Ambulatory Visit
Admission: RE | Admit: 2019-11-30 | Discharge: 2019-11-30 | Disposition: A | Discharge status: Home / Self Care | Payer: Medicare Other | Source: Ambulatory Visit | Attending: Radiation Oncology | Admitting: Radiation Oncology

## 2019-11-30 ENCOUNTER — Other Ambulatory Visit: Payer: Self-pay | Admitting: Oncology

## 2019-11-30 ENCOUNTER — Other Ambulatory Visit: Payer: Self-pay

## 2019-11-30 DIAGNOSIS — Z170 Estrogen receptor positive status [ER+]: Secondary | ICD-10-CM | POA: Diagnosis not present

## 2019-11-30 DIAGNOSIS — C50411 Malignant neoplasm of upper-outer quadrant of right female breast: Secondary | ICD-10-CM

## 2019-11-30 DIAGNOSIS — Z510 Encounter for antineoplastic radiation therapy: Secondary | ICD-10-CM | POA: Diagnosis not present

## 2019-11-30 MED ORDER — SONAFINE EX EMUL
1.0000 "application " | Freq: Once | CUTANEOUS | Status: AC
  Administered 2019-11-30: 1 via TOPICAL

## 2019-11-30 NOTE — Progress Notes (Unsigned)
[place]  [phone] Fax:([phone]) [phone]     ID: Delsa Grana DOB: May 24, 1941  MR#: [id number]  IRJ#:[id number]  Patient Care Team: [name], MD as PCP - General (Internal Medicine) Armandina Gemma, MD as Consulting Physician (General Surgery) Mauro Kaufmann, RN as Oncology Nurse Navigator Rockwell Germany, RN as Oncology Nurse Navigator Eppie Gibson, MD as Consulting Physician (Radiation Oncology) Shanicka Oldenkamp, Virgie Dad, MD as Consulting Physician (Oncology) Princess Bruins, MD as Consulting Physician (Obstetrics and Gynecology) [facility], Jennefer Bravo, MD as Referring Physician (Dermatology) Ronald Lobo, MD as Consulting Physician (Gastroenterology) Marica Otter, OD (Optometry) Chauncey Cruel, MD OTHER MD:  CHIEF COMPLAINT: Estrogen receptor positive breast cancer  CURRENT TREATMENT: Adjuvant radiation pending   HISTORY OF CURRENT ILLNESS: KIMBERLIE CSASZAR initially noted a palpable upper-outer right breast lump in 12/2014. Nothing was seen on imaging at the time--we reviewed her mammograms and ultrasounds back to 2015 today--and she continued on annual mammography as scheduled until 07/30/2019.  At that time she underwent routine bilateral diagnostic mammography with tomography and right breast ultrasonography at [facility] showing: breast density category B; 2 cm irregular mass in the right breast at 10 o'clock. Right axilla was imaged by ultrasound on 07/30/2019 and was normal (information per Dr. Gareth Eagle email dated 10/24/2019).  Accordingly on 09/23/2019 Jaryn proceeded to biopsy of the right breast area in question. The pathology from this procedure ([id number]) showed: benign breast tissue.  This was felt to be discordant and repeat biopsy was performed on 10/07/2019 showing ([id number]): invasive ductal carcinoma, grade 1 or 2; ductal carcinoma in situ. Prognostic indicators significant for: estrogen receptor, 100% positive with strong  staining intensity, and progesterone receptor, 60% positive with weak staining intensity. Proliferation marker Ki67 at 5%. HER2 negative by immunohistochemistry (1+).  She opted to proceed with right lumpectomy on 10/20/2019 under Dr. Harlow Asa. 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   Dr. Harlow Asa has commented on the margin issue: "While the pathologist is calling positive and close margins, there is really not any additional tissue to take. The anterior margin would be subcutaneous tissue, of which little was left. The lateral margin likewise is abutted by subcutaneous adipose tissue of which little was left. I therefore do not think re-excision is an option. I guess we could ultrasound to see if there is any sign of residual breast tissue anteriorly or laterally that could be removed."  The case was reviewed radiologically by Dr. Isaiah Blakes and on her 10/24/2019 email she states "I am confirming my opinion that if both clips were well located on specimen radiograph that no additional imaging would help [place] with margin status."  The patient's subsequent history is as detailed below.   INTERVAL HISTORY: Anniece was evaluated in the breast cancer clinic on 79/22/2021 with her daughter Lorenz Coaster participating by speaker phone.  Her case was also presented at the multidisciplinary breast cancer conference 10/14/2019.  At that time a preliminary plan was suggested: breast conserving surgery with no sentinel lymph node removal given patient's age 79 and tumor phenotype, to minimize the risk of lymphedema; adjuvant radiation, adjuvant antiestrogens.  REVIEW OF SYSTEMS: Ajahnae denies unusual headaches, visual changes, nausea, vomiting, stiff neck, dizziness, or gait imbalance. There has been no cough, phlegm production, or pleurisy, no chest  pain or pressure, and no change in bowel or bladder habits. The patient denies fever, rash,  bleeding, unexplained fatigue or unexplained weight loss.  She exercises regularly by playing golf and also at the gym.  A detailed review of systems was otherwise entirely negative.   PAST MEDICAL HISTORY: Past Medical History:  Diagnosis Date  . Breast cancer ([place])   . 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 Elevated cholesterol   . Hypertension   . Leiomyoma   . 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 BASAL CELL CARCINOMA EXCISION    . BREAST SURGERY  1975   Cyst  . MASTECTOMY, PARTIAL Right 10/20/2019   Procedure: RIGHT PARTIAL MASTECTOMY;  Surgeon: Armandina Gemma, MD;  Location: [place];  Service: General;  Laterality: Right;  . THYROID SURGERY  1975   Cyst    FAMILY HISTORY: Family History  Problem Relation Age of Onset  . Hypertension Mother   . Heart disease Father   . Colon polyps Father   . Colon polyps Brother    The patient's father died from congestive heart failure in his late 65s.  The patient's mother died at age 46.  The family is of Ashkenazi descent.  The maternal grandmother had "blood cancer".  There are however no other cancers in the family to the patient's knowledge   GYNECOLOGIC HISTORY:  No LMP recorded. Patient is postmenopausal. Menarche: 79 years old Age at first live birth: 79 years old [place] P 2 LMP late 25s Contraceptive: Oral contraceptives for some years with no complications HRT "a few years"  Hysterectomy? no BSO? 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  Her husband Herbie Baltimore is an attorney dealing with several cases.  Daughter Retia Passe,, 73, is a Secondary school teacher in [place]; daughter Sharee Pimple is a Pharmacist, hospital in [place] (her husband is an Magazine features editor at [employer]).  The patient has 4 grandchildren, one in  [place] and 3 in [place].  She attends [employer]  ADVANCED DIRECTIVES: In the absence of any documents to the contrary the patient's husband is her healthcare power of attorney   HEALTH MAINTENANCE: Social History   Tobacco Use  . Smoking status: Never Smoker  . Smokeless tobacco: Never Used  Substance Use Topics  . Alcohol use: Yes    Alcohol/week: 0.0 standard drinks    Comment: rare  . Drug use: No     Colonoscopy: 2019/[facility]  PAP: 07/2019, negative  Bone density: 07/2018, -2.7   Allergies  Allergen Reactions  . Sulfa Antibiotics Hives  . Terazol [Terconazole] Itching  . Ambien [Zolpidem Tartrate] Palpitations  . Codeine Palpitations    Current Outpatient Medications  Medication Sig Dispense Refill  . acetaminophen (TYLENOL) 500 MG tablet Take 1,000 mg by mouth every 6 (six) hours as needed for moderate pain.    Marland Kitchen aspirin 81 MG tablet Take 81 mg by mouth daily. Takes about twice weekly    . atorvastatin (LIPITOR) 10 MG tablet Take 10 mg by mouth daily.    . Calcium Carbonate-Vitamin D (CALCIUM + D PO) Take by mouth daily.      Marland Kitchen ibuprofen (ADVIL,MOTRIN) 200 MG tablet Take 400-600 mg by mouth every 6 (six) hours as needed for moderate pain.    . metoprolol succinate (TOPROL-XL) 50 MG 24 hr tablet TAKE 1 TABLET ONCE DAILY. 30 tablet 1   No current facility-administered medications for this visit.    OBJECTIVE: Middle-aged white  woman who appears younger than stated age  There were no vitals filed for this visit.   There is no height or weight on file to calculate BMI.   Wt Readings from Last 3 Encounters:  11/02/19 116 lb 8 oz (52.8 kg)  10/20/19 115 lb 1.3 oz (52.2 kg)  07/13/19 115 lb (52.2 kg)      ECOG FS:1 - Symptomatic but completely ambulatory  Ocular: Sclerae unicteric, pupils round and equal Ear-nose-throat: Wearing a mask Lymphatic: No cervical or supraclavicular adenopathy Lungs no rales or rhonchi Heart regular rate and rhythm Abd  soft, nontender, positive bowel sounds MSK no focal spinal tenderness, no joint edema Neuro: non-focal, well-oriented, appropriate affect Breasts: The right breast is status post recent lumpectomy.  The incision is healing well, without dehiscence, erythema, or swelling.  The left breast is benign.  Both axillae are benign.   LAB RESULTS:  CMP     Component Value Date/Time   NA 141 11/02/2019 1531   K 4.0 11/02/2019 1531   CL 104 11/02/2019 1531   CO2 28 11/02/2019 1531   GLUCOSE 124 (H) 11/02/2019 1531   BUN 21 11/02/2019 1531   CREATININE 0.92 11/02/2019 1531   CALCIUM 9.0 11/02/2019 1531   PROT 7.0 11/02/2019 1531   ALBUMIN 3.9 11/02/2019 1531   AST 15 11/02/2019 1531   ALT 17 11/02/2019 1531   ALKPHOS 85 11/02/2019 1531   BILITOT 0.3 11/02/2019 1531   GFRNONAA 60 (L) 11/02/2019 1531   GFRAA >60 11/02/2019 1531    No results found for: TOTALPROTELP, ALBUMINELP, A1GS, A2GS, BETS, BETA2SER, GAMS, MSPIKE, SPEI  Lab Results  Component Value Date   WBC 16.3 (H) 11/02/2019   NEUTROABS 5.5 11/02/2019   HGB 12.8 11/02/2019   HCT 39.1 11/02/2019   MCV 97.8 11/02/2019   PLT 218 11/02/2019    No results found for: LABCA2  No components found for: UYQIHK742  No results for input(s): INR in the last 168 hours.  No results found for: LABCA2  No results found for: VZD638  No results found for: VFI433  No results found for: IRJ188  No results found for: CA2729  No components found for: HGQUANT  No results found for: CEA1 / No results found for: CEA1   No results found for: AFPTUMOR  No results found for: CHROMOGRNA  No results found for: KPAFRELGTCHN, LAMBDASER, KAPLAMBRATIO (kappa/lambda light chains)  No results found for: HGBA, HGBA2QUANT, HGBFQUANT, HGBSQUAN (Hemoglobinopathy evaluation)   No results found for: LDH  No results found for: IRON, TIBC, IRONPCTSAT (Iron and TIBC)  No results found for: FERRITIN  Urinalysis    Component Value  Date/Time   COLORURINE YELLOW 12/15/2014 1037   APPEARANCEUR CLEAR 12/15/2014 1037   LABSPEC 1.013 12/15/2014 1037   PHURINE 6.5 12/15/2014 1037   GLUCOSEU NEG 12/15/2014 1037   HGBUR NEG 12/15/2014 1037   BILIRUBINUR NEG 12/15/2014 1037   KETONESUR NEG 12/15/2014 1037   PROTEINUR NEG 12/15/2014 1037   UROBILINOGEN 0.2 12/15/2014 1037   NITRITE NEG 12/15/2014 1037   LEUKOCYTESUR NEG 12/15/2014 1037     STUDIES: No results found.   ELIGIBLE FOR AVAILABLE RESEARCH PROTOCOL: AET (at start of antiestrogens).  ASSESSMENT: 79 y.o. [place] woman status post right breast upper outer quadrant biopsy 10/07/2019 for a clinical T1c N0 invasive ductal carcinoma, grade 1 or 2, estrogen and progesterone receptor positive, HER-2 not amplified, with an MIB-1 of 5%  (1) status post right lumpectomy without sentinel lymph node sampling 10/20/2019  for a T2 NX, stage IB invasive ductal carcinoma, grade 2, with a focally positive anterior margin  (a) no residual breast tissue at anterior margin; no further surgery indicated  (2) Oncotype score pending: Chemotherapy not anticipated  (3) adjuvant radiation (4 weeks) to follow  (4) antiestrogens to start at the completion of local therapy  PLAN: I met today with Jeriann to review her new diagnosis. Specifically we discussed the biology of her breast cancer, its diagnosis, staging, treatment  options and prognosis. We first reviewed the fact that cancer is not one disease but more than 100 different diseases and that it is important to keep them separate-- otherwise when friends and relatives discuss their own cancer experiences with Anneliese confusion can result. Similarly we explained that if breast cancer spreads to the bone or liver, the patient would not have bone cancer or liver cancer, but breast cancer in the bone and breast cancer in the liver: one cancer in three places-- not 3 different cancers which otherwise would have to be treated in 3 different  ways.  We discussed the difference between local and systemic therapy. In terms of loco-regional treatment, lumpectomy plus radiation is equivalent to mastectomy as far as survival is concerned.  She has had her surgery and adjuvant radiation is pending  We then discussed the rationale for systemic therapy. There is some risk that this cancer may have already spread to other parts of her body.  The risk is low in stage I patients, but not 0.  Patients frequently ask at this point about bone scans, CAT scans and PET scans to find out if they have occult breast cancer somewhere else. The problem is that in early stage disease we are much more likely to find false positives then true cancers and this would expose the patient to unnecessary procedures as well as unnecessary radiation. Scans cannot answer the question the patient really would like to know, which is whether she has microscopic disease elsewhere in her body. For those reasons scans are not recommended in this setting.  Of course we would proceed to aggressive evaluation of any symptoms that might suggest metastatic disease, but that is not the case here.  Next we went over the options for systemic therapy which are anti-estrogens, anti-HER-2 immunotherapy, and chemotherapy. Brecklynn does not meet criteria for anti-HER-2 immunotherapy. She is a good candidate for anti-estrogens.  The question of chemotherapy is more complicated. Chemotherapy is most effective in rapidly growing, aggressive tumors. It is much less effective in not-high-grade, slow growing cancers, like Raniya 's. For that reason I anticipate that she will have a good prognosis without chemotherapy and that chemotherapy would not make her good prognosis better.  We will confirm this with an Oncotype from the definitive surgical sample, which is currently pending   The plan then, assuming the Oncotype comes back low risk as expected, would be to follow-up with radiation and then see me  likely early May to discuss antiestrogens which would be continued for 5 years.  Imani does qualify for genetics testing. In patients who carry a deleterious mutation [for example in a  BRCA gene], the risk of a new breast cancer developing in the future may be sufficiently great that the patient may choose bilateral mastectomies. However if she wishes to keep her breasts in that situation it is safe to do so. That would require intensified screening, which generally means not only yearly mammography but a yearly breast MRI as well.  I have  placed a genetics referral and anticipate the blood test can be obtained at the time she returns for radiation treatments.  [place] has a good understanding of the overall plan. She agrees with it. She knows the goal of treatment in her case is cure. She will call with any problems that may develop before her next visit here.  Total encounter time 65 minutes.Chauncey Cruel, MD   11/30/2019 5:09 PM Medical Oncology and Hematology [facility] [place], [place] [address] Tel. [phone]    Fax. [phone]   This document serves as a record of services personally performed by Lurline Del, MD. It was created on his behalf by Wilburn Mylar, a trained medical scribe. The creation of this record is based on the scribe's personal observations and the provider's statements to them.   I, Lurline Del MD, have reviewed the above documentation for accuracy and completeness, and I agree with the above.    *Total Encounter Time as defined by the Centers for Medicare and Medicaid Services includes, in addition to the face-to-face time of a patient visit (documented in the note above) non-face-to-face time: obtaining and reviewing outside history, ordering and reviewing medications, tests or procedures, care coordination (communications with other health care professionals or caregivers) and documentation in the medical  record.

## 2019-12-01 ENCOUNTER — Other Ambulatory Visit: Payer: Self-pay

## 2019-12-01 ENCOUNTER — Telehealth: Payer: Self-pay | Admitting: Oncology

## 2019-12-01 ENCOUNTER — Ambulatory Visit
Admission: RE | Admit: 2019-12-01 | Discharge: 2019-12-01 | Disposition: A | Discharge status: Home / Self Care | Payer: Medicare Other | Source: Ambulatory Visit | Attending: Radiation Oncology | Admitting: Radiation Oncology

## 2019-12-01 DIAGNOSIS — C50411 Malignant neoplasm of upper-outer quadrant of right female breast: Secondary | ICD-10-CM | POA: Diagnosis not present

## 2019-12-01 DIAGNOSIS — Z510 Encounter for antineoplastic radiation therapy: Secondary | ICD-10-CM | POA: Diagnosis not present

## 2019-12-01 DIAGNOSIS — Z170 Estrogen receptor positive status [ER+]: Secondary | ICD-10-CM | POA: Diagnosis not present

## 2019-12-01 NOTE — Telephone Encounter (Signed)
Scheduled appt per 3/22 sch message - unable to reach pt .left message with appt date and time   

## 2019-12-02 ENCOUNTER — Ambulatory Visit
Admission: RE | Admit: 2019-12-02 | Discharge: 2019-12-02 | Disposition: A | Discharge status: Home / Self Care | Payer: Medicare Other | Source: Ambulatory Visit | Attending: Radiation Oncology | Admitting: Radiation Oncology

## 2019-12-02 ENCOUNTER — Other Ambulatory Visit: Payer: Self-pay

## 2019-12-02 DIAGNOSIS — C50411 Malignant neoplasm of upper-outer quadrant of right female breast: Secondary | ICD-10-CM | POA: Diagnosis not present

## 2019-12-02 DIAGNOSIS — Z170 Estrogen receptor positive status [ER+]: Secondary | ICD-10-CM | POA: Diagnosis not present

## 2019-12-02 DIAGNOSIS — Z510 Encounter for antineoplastic radiation therapy: Secondary | ICD-10-CM | POA: Diagnosis not present

## 2019-12-03 ENCOUNTER — Ambulatory Visit
Admission: RE | Admit: 2019-12-03 | Discharge: 2019-12-03 | Disposition: A | Discharge status: Home / Self Care | Payer: Medicare Other | Source: Ambulatory Visit | Attending: Radiation Oncology | Admitting: Radiation Oncology

## 2019-12-03 ENCOUNTER — Other Ambulatory Visit: Payer: Self-pay

## 2019-12-03 DIAGNOSIS — Z510 Encounter for antineoplastic radiation therapy: Secondary | ICD-10-CM | POA: Diagnosis not present

## 2019-12-03 DIAGNOSIS — Z170 Estrogen receptor positive status [ER+]: Secondary | ICD-10-CM | POA: Diagnosis not present

## 2019-12-03 DIAGNOSIS — C50411 Malignant neoplasm of upper-outer quadrant of right female breast: Secondary | ICD-10-CM | POA: Diagnosis not present

## 2019-12-04 ENCOUNTER — Ambulatory Visit: Admission: RE | Admit: 2019-12-04 | Payer: Medicare Other | Source: Ambulatory Visit | Admitting: Radiation Oncology

## 2019-12-04 ENCOUNTER — Other Ambulatory Visit: Payer: Self-pay

## 2019-12-04 ENCOUNTER — Ambulatory Visit
Admission: RE | Admit: 2019-12-04 | Discharge: 2019-12-04 | Disposition: A | Discharge status: Home / Self Care | Payer: Medicare Other | Source: Ambulatory Visit | Attending: Radiation Oncology | Admitting: Radiation Oncology

## 2019-12-04 ENCOUNTER — Ambulatory Visit: Payer: Medicare Other | Admitting: Radiation Oncology

## 2019-12-04 DIAGNOSIS — Z170 Estrogen receptor positive status [ER+]: Secondary | ICD-10-CM | POA: Diagnosis not present

## 2019-12-04 DIAGNOSIS — C50411 Malignant neoplasm of upper-outer quadrant of right female breast: Secondary | ICD-10-CM | POA: Diagnosis not present

## 2019-12-04 DIAGNOSIS — Z510 Encounter for antineoplastic radiation therapy: Secondary | ICD-10-CM | POA: Diagnosis not present

## 2019-12-07 ENCOUNTER — Other Ambulatory Visit: Payer: Self-pay

## 2019-12-07 ENCOUNTER — Ambulatory Visit
Admission: RE | Admit: 2019-12-07 | Discharge: 2019-12-07 | Disposition: A | Discharge status: Home / Self Care | Payer: Medicare Other | Source: Ambulatory Visit | Attending: Radiation Oncology | Admitting: Radiation Oncology

## 2019-12-07 DIAGNOSIS — C50411 Malignant neoplasm of upper-outer quadrant of right female breast: Secondary | ICD-10-CM | POA: Diagnosis not present

## 2019-12-07 DIAGNOSIS — Z170 Estrogen receptor positive status [ER+]: Secondary | ICD-10-CM | POA: Diagnosis not present

## 2019-12-07 DIAGNOSIS — Z510 Encounter for antineoplastic radiation therapy: Secondary | ICD-10-CM | POA: Diagnosis not present

## 2019-12-08 ENCOUNTER — Ambulatory Visit
Admission: RE | Admit: 2019-12-08 | Discharge: 2019-12-08 | Disposition: A | Discharge status: Home / Self Care | Payer: Medicare Other | Source: Ambulatory Visit | Attending: Radiation Oncology | Admitting: Radiation Oncology

## 2019-12-08 ENCOUNTER — Other Ambulatory Visit: Payer: Self-pay

## 2019-12-08 DIAGNOSIS — C50411 Malignant neoplasm of upper-outer quadrant of right female breast: Secondary | ICD-10-CM | POA: Diagnosis not present

## 2019-12-08 DIAGNOSIS — H25043 Posterior subcapsular polar age-related cataract, bilateral: Secondary | ICD-10-CM | POA: Diagnosis not present

## 2019-12-08 DIAGNOSIS — Z510 Encounter for antineoplastic radiation therapy: Secondary | ICD-10-CM | POA: Diagnosis not present

## 2019-12-08 DIAGNOSIS — H2512 Age-related nuclear cataract, left eye: Secondary | ICD-10-CM | POA: Diagnosis not present

## 2019-12-08 DIAGNOSIS — Z170 Estrogen receptor positive status [ER+]: Secondary | ICD-10-CM | POA: Diagnosis not present

## 2019-12-08 DIAGNOSIS — H2513 Age-related nuclear cataract, bilateral: Secondary | ICD-10-CM | POA: Diagnosis not present

## 2019-12-08 DIAGNOSIS — H18413 Arcus senilis, bilateral: Secondary | ICD-10-CM | POA: Diagnosis not present

## 2019-12-08 DIAGNOSIS — H18593 Other hereditary corneal dystrophies, bilateral: Secondary | ICD-10-CM | POA: Diagnosis not present

## 2019-12-08 DIAGNOSIS — H25013 Cortical age-related cataract, bilateral: Secondary | ICD-10-CM | POA: Diagnosis not present

## 2019-12-09 ENCOUNTER — Ambulatory Visit
Admission: RE | Admit: 2019-12-09 | Discharge: 2019-12-09 | Disposition: A | Discharge status: Home / Self Care | Payer: Medicare Other | Source: Ambulatory Visit | Attending: Radiation Oncology | Admitting: Radiation Oncology

## 2019-12-09 ENCOUNTER — Other Ambulatory Visit: Payer: Self-pay

## 2019-12-09 DIAGNOSIS — Z170 Estrogen receptor positive status [ER+]: Secondary | ICD-10-CM | POA: Diagnosis not present

## 2019-12-09 DIAGNOSIS — C50411 Malignant neoplasm of upper-outer quadrant of right female breast: Secondary | ICD-10-CM | POA: Diagnosis not present

## 2019-12-09 DIAGNOSIS — Z510 Encounter for antineoplastic radiation therapy: Secondary | ICD-10-CM | POA: Diagnosis not present

## 2019-12-10 ENCOUNTER — Ambulatory Visit
Admission: RE | Admit: 2019-12-10 | Discharge: 2019-12-10 | Disposition: A | Discharge status: Home / Self Care | Payer: Medicare Other | Source: Ambulatory Visit | Attending: Radiation Oncology | Admitting: Radiation Oncology

## 2019-12-10 ENCOUNTER — Other Ambulatory Visit: Payer: Self-pay

## 2019-12-10 ENCOUNTER — Encounter: Payer: Self-pay | Admitting: *Deleted

## 2019-12-10 DIAGNOSIS — Z510 Encounter for antineoplastic radiation therapy: Secondary | ICD-10-CM | POA: Insufficient documentation

## 2019-12-10 DIAGNOSIS — Z170 Estrogen receptor positive status [ER+]: Secondary | ICD-10-CM | POA: Diagnosis not present

## 2019-12-10 DIAGNOSIS — C50411 Malignant neoplasm of upper-outer quadrant of right female breast: Secondary | ICD-10-CM | POA: Diagnosis not present

## 2019-12-10 NOTE — Progress Notes (Addendum)
Deal  [phone] [phone]) [phone]     ID: Vanessa Shaffer DOB: 07/05/1941  MR#: [id number]  GHW#:[id number]  Patient Care Team: [name], MD as PCP - General (Internal Medicine) Armandina Gemma, MD as Consulting Physician (General Surgery) Mauro Kaufmann, RN as Oncology Nurse Navigator Rockwell Germany, RN as Oncology Nurse Navigator Eppie Gibson, MD as Consulting Physician (Radiation Oncology) Izaiah Tabb, Virgie Dad, MD as Consulting Physician (Oncology) Princess Bruins, MD as Consulting Physician (Obstetrics and Gynecology) [facility], Jennefer Bravo, MD as Referring Physician (Dermatology) Ronald Lobo, MD as Consulting Physician (Gastroenterology) Marica Otter, OD (Optometry) Chauncey Cruel, MD OTHER MD:  CHIEF COMPLAINT: Estrogen receptor positive breast cancer  CURRENT TREATMENT: to start anastrozole   INTERVAL HISTORY: Vanessa Shaffer returns today for follow up of her estrogen receptor positive breast cancer.  Her daughter Vanessa Shaffer participated by speaker phone  An Oncotype DX was obtained on the final surgical sample and the score of 19 predicts a risk of recurrence outside the breast over the next 9 years of 6%, if the patient's only systemic therapy is an antiestrogen for 5 years.  It also predicts no significant benefit from chemotherapy.  She proceeded to radiation therapy under Dr. Isidore Moos. She began treatment on 11/16/2019 and is scheduled to finish today.   REVIEW OF SYSTEMS: Margene did remarkably well with her radiation treatments.  She has had minimal fatigue, no significant desquamation.  She has a little bit of a rash medially and under the right breast.  She is using the radio Plex cream and also hydrocortisone cream for this.  Aside from this she remains incredibly active although the weather has not allowed her to play golf.  She has to drive her husband around because of his recent surgery.  She did quite a bit of cooking and  preparation for the past over holiday, and she was delighted that both her daughters and their husbands were able to come this year.  Aside from that a detailed review of systems today was stable.   HISTORY OF CURRENT ILLNESS: From the original intake note:  Vanessa Shaffer initially noted a palpable upper-outer right breast lump in 12/2014. Nothing was seen on imaging at the time--we reviewed her mammograms and ultrasounds back to 2015 today--and she continued on annual mammography as scheduled until 07/30/2019.  At that time she underwent routine bilateral diagnostic mammography with tomography and right breast ultrasonography at [facility] showing: breast density category B; 2 cm irregular mass in the right breast at 10 o'clock. Right axilla was imaged by ultrasound on 07/30/2019 and was normal (information per Dr. Gareth Eagle email dated 10/24/2019).  Accordingly on 09/23/2019 [place] proceeded to biopsy of the right breast area in question. The pathology from this procedure ([id number]) showed: benign breast tissue.  This was felt to be discordant and repeat biopsy was performed on 10/07/2019 showing ([id number]): invasive ductal carcinoma, grade 1 or 2; ductal carcinoma in situ. Prognostic indicators significant for: estrogen receptor, 100% positive with strong staining intensity, and progesterone receptor, 60% positive with weak staining intensity. Proliferation marker Ki67 at 5%. HER2 negative by immunohistochemistry (1+).  She opted to proceed with right lumpectomy on 10/20/2019 under Dr. Harlow Asa. 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   Dr. Harlow Asa has commented on the margin issue: "While the pathologist is calling positive and close margins, there is really not  any additional tissue to take. The anterior margin would be  subcutaneous tissue, of which little was left. The lateral margin likewise is abutted by subcutaneous adipose tissue of which little was left. I therefore do not think re-excision is an option. I guess we could ultrasound to see if there is any sign of residual breast tissue anteriorly or laterally that could be removed."  The case was reviewed radiologically by Dr. Isaiah Blakes and on her 10/24/2019 email she states "I am confirming my opinion that if both clips were well located on specimen radiograph that no additional imaging would help [place] with margin status."  The patient's subsequent history is as detailed below.   PAST MEDICAL HISTORY: Past Medical History:  Diagnosis Date  . Breast cancer ([place])   . 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 Elevated cholesterol   . Hypertension   . Leiomyoma   . 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 BASAL CELL CARCINOMA EXCISION    . BREAST SURGERY  1975   Cyst  . MASTECTOMY, PARTIAL Right 10/20/2019   Procedure: RIGHT PARTIAL MASTECTOMY;  Surgeon: Armandina Gemma, MD;  Location: [facility];  Service: General;  Laterality: Right;  . THYROID SURGERY  1975   Cyst    FAMILY HISTORY: Family History  Problem Relation Age of Onset  . Hypertension Mother   . Heart disease Father   . Colon polyps Father   . Colon polyps Brother    The patient's father died from congestive heart failure in his late 30s.  The patient's mother died at age 67.  The family is of Ashkenazi descent.  The maternal grandmother had "blood cancer".  There are however no other cancers in the family to the patient's knowledge   GYNECOLOGIC HISTORY:  No LMP recorded. Patient is postmenopausal. Menarche: 79 years old Age at first live birth: 79 years old [place] P 2 LMP late 27s Contraceptive: Oral contraceptives for some years with no complications HRT "a few years"  Hysterectomy?  no BSO? 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  Her husband Vanessa Shaffer is an attorney dealing with           cases.  Daughter Retia Passe,, 15, is a Secondary school teacher in [place]; daughter Vanessa Shaffer is a Pharmacist, hospital in [place] (her husband is an Magazine features editor at [employer]).  The patient has 4 grandchildren, one in [place] and 3 in [place].  She attends [employer]   ADVANCED DIRECTIVES: In the absence of any documents to the contrary the patient's husband is her healthcare power of attorney   HEALTH MAINTENANCE: Social History   Tobacco Use  . Smoking status: Never Smoker  . Smokeless tobacco: Never Used  Substance Use Topics  . Alcohol use: Yes    Alcohol/week: 0.0 standard drinks    Comment: rare  . Drug use: No     Colonoscopy: 2019/[facility]ccini  PAP: 07/2019, negative  Bone density: 07/2018, -2.7   Allergies  Allergen Reactions  . Sulfa Antibiotics Hives  . Terazol [Terconazole] Itching  . Ambien [Zolpidem Tartrate] Palpitations  . Codeine Palpitations    Current Outpatient Medications  Medication Sig Dispense Refill  . acetaminophen (TYLENOL) 500 MG tablet Take 1,000 mg by mouth every 6 (six) hours as needed for moderate pain.    Marland Kitchen anastrozole (ARIMIDEX) 1 MG tablet Take 1 tablet (  1 mg total) by mouth daily. 90 tablet 4  . aspirin 81 MG tablet Take 81 mg by mouth daily. Takes about twice weekly    . atorvastatin (LIPITOR) 10 MG tablet Take 10 mg by mouth daily.    . Calcium Carbonate-Vitamin D (CALCIUM + D PO) Take by mouth daily.      Marland Kitchen ibuprofen (ADVIL,MOTRIN) 200 MG tablet Take 400-600 mg by mouth every 6 (six) hours as needed for moderate pain.    . metoprolol succinate (TOPROL-XL) 50 MG 24 hr tablet TAKE 1 TABLET ONCE DAILY. 30 tablet 1   No current facility-administered medications for this visit.    OBJECTIVE: white woman appears younger than stated age  54:   12/11/19 0900  BP: (!) 156/86  Pulse: 66  Resp: 18   Temp: 97.8 F (36.6 C)  SpO2: 100%     Body mass index is 20.1 kg/m.   Wt Readings from Last 3 Encounters:  12/11/19 117 lb 1.6 oz (53.1 kg)  11/02/19 116 lb 8 oz (52.8 kg)  10/20/19 115 lb 1.3 oz (52.2 kg)      ECOG FS:1 - Symptomatic but completely ambulatory  Sclerae unicteric, EOMs intact Wearing a mask No cervical or supraclavicular adenopathy Lungs no rales or rhonchi Heart regular rate and rhythm Abd soft, nontender, positive bowel sounds MSK no focal spinal tenderness, no upper extremity lymphedema Neuro: nonfocal, well oriented, appropriate affect Breasts: The right breast is status post lumpectomy and radiation.  There is minimal erythema.  There is no desquamation.  There is erythema in the inferior mammary fold and there is a scattered rash in the upper medial chest.  The left breast is benign.  Both axillae are benign.   LAB RESULTS:  CMP     Component Value Date/Time   NA 141 11/02/2019 1531   K 4.0 11/02/2019 1531   CL 104 11/02/2019 1531   CO2 28 11/02/2019 1531   GLUCOSE 124 (H) 11/02/2019 1531   BUN 21 11/02/2019 1531   CREATININE 0.92 11/02/2019 1531   CALCIUM 9.0 11/02/2019 1531   PROT 7.0 11/02/2019 1531   ALBUMIN 3.9 11/02/2019 1531   AST 15 11/02/2019 1531   ALT 17 11/02/2019 1531   ALKPHOS 85 11/02/2019 1531   BILITOT 0.3 11/02/2019 1531   GFRNONAA 60 (L) 11/02/2019 1531   GFRAA >60 11/02/2019 1531    No results found for: TOTALPROTELP, ALBUMINELP, A1GS, A2GS, BETS, BETA2SER, GAMS, MSPIKE, SPEI  Lab Results  Component Value Date   WBC 16.3 (H) 11/02/2019   NEUTROABS 5.5 11/02/2019   HGB 12.8 11/02/2019   HCT 39.1 11/02/2019   MCV 97.8 11/02/2019   PLT 218 11/02/2019    No results found for: LABCA2  No components found for: DUKGUR427  No results for input(s): INR in the last 168 hours.  No results found for: LABCA2  No results found for: CWC376  No results found for: EGB151  No results found for: VOH607  No results  found for: CA2729  No components found for: HGQUANT  No results found for: CEA1 / No results found for: CEA1   No results found for: AFPTUMOR  No results found for: CHROMOGRNA  No results found for: KPAFRELGTCHN, LAMBDASER, KAPLAMBRATIO (kappa/lambda light chains)  No results found for: HGBA, HGBA2QUANT, HGBFQUANT, HGBSQUAN (Hemoglobinopathy evaluation)   No results found for: LDH  No results found for: IRON, TIBC, IRONPCTSAT (Iron and TIBC)  No results found for: FERRITIN  Urinalysis    Component Value  Date/Time   COLORURINE YELLOW 12/15/2014 [place] 12/15/2014 1037   LABSPEC 1.013 12/15/2014 1037   PHURINE 6.5 12/15/2014 1037   GLUCOSEU NEG 12/15/2014 1037   HGBUR NEG 12/15/2014 1037   BILIRUBINUR NEG 12/15/2014 1037   KETONESUR NEG 12/15/2014 1037   PROTEINUR NEG 12/15/2014 1037   UROBILINOGEN 0.2 12/15/2014 1037   NITRITE NEG 12/15/2014 1037   LEUKOCYTESUR NEG 12/15/2014 1037    STUDIES: No results found.   ELIGIBLE FOR AVAILABLE RESEARCH PROTOCOL: AET (at start of antiestrogens).  ASSESSMENT: 79 y.o. [place] woman status post right breast upper outer quadrant biopsy 10/07/2019 for a clinical T1c N0 invasive ductal carcinoma, grade 1 or 2, estrogen and progesterone receptor positive, HER-2 not amplified, with an MIB-1 of 5%  (1) status post right lumpectomy without sentinel lymph node sampling 10/20/2019 for a T2 NX, stage IB invasive ductal carcinoma, grade 2, with a focally positive anterior margin  (a) per surgeon: no residual breast tissue at anterior margin; no further surgery indicated  (2) Oncotype score of 19 predicts a risk of recurrence outside the breast over the next 9 years of 6% if the patient's only systemic therapy is an antiestrogen for 5 years.  It also predicts no benefit from chemotherapy.  (3) adjuvant radiation (4 weeks) completed 12/11/2019  (4) start anastrozole 12/29/2019  (a) bone density 07/30/2018 at  [facility] gynecology shows osteoporosis  (5) genetics testing   PLAN: [place] has completed local therapy for her breast cancer and she did remarkably well both with the surgery and the radiation.  She is now ready to consider systemic therapy.  We reviewed the Oncotype results which tell us that she has an excellent long-term prognosis without the need of chemotherapy and that if she did take chemotherapy it would not further improve that already very good prognosis.  She does need to take antiestrogens for 5 years if she wishes to achieve the prognosis predicted by the Oncotype.  Accordingly we discussed the difference between tamoxifen and anastrozole in detail today. She understands that anastrozole and the aromatase inhibitors in general work by blocking estrogen production. Accordingly vaginal dryness, decrease in bone density, and of course hot flashes can result. The aromatase inhibitors can also negatively affect the cholesterol profile, although that is a minor effect. One out of 5 women on aromatase inhibitors we will feel "old and achy". This arthralgia/myalgia syndrome, which resembles fibromyalgia clinically, does resolve with stopping the medications. Accordingly this is not a reason to not try an aromatase inhibitor but it is a frequent reason to stop it (in other words 20% of women will not be able to tolerate these medications).  Tamoxifen on the other hand does not block estrogen production. It does not "take away a woman's estrogen". It blocks the estrogen receptor in breast cells. Like anastrozole, it can also cause hot flashes. As opposed to anastrozole, tamoxifen has many estrogen-like effects. It is technically an estrogen receptor modulator. This means that in some tissues tamoxifen works like estrogen-- for example it helps strengthen the bones. It tends to improve the cholesterol profile. It can cause thickening of the endometrial lining, and even endometrial polyps or rarely  cancer of the uterus.(The risk of uterine cancer due to tamoxifen is one additional cancer per thousand women year). It can cause vaginal wetness or stickiness. It can cause blood clots through this estrogen-like effect--the risk of blood clots with tamoxifen is exactly the same as with birth control pills or hormone replacement.  Neither of these agents causes mood changes or weight gain, despite the popular belief that they can have these side effects. We have data from studies comparing either of these drugs with placebo, and in those cases the control group had the same amount of weight gain and depression as the group that took the drug.  After this discussion we agreed anastrozole would be a better choice for her.  She will started 12/29/2019 to give her a little time to recover from radiation even though she really does feel remarkably well.  She will then see me mid July.  If she does tolerate anastrozole well the next step is to decide what to do with her osteoporosis problem.  She is already on vitamin D and is very much of a walker.  We will need to add either Fosamax, Boniva, Reclast, or Prolia.  We did discuss the possible toxicities side effects and complications of these agents in detail today and we will make a definitive decision when she returns to see me  She had been scheduled for genetics but for some reason we still do not have any data regarding that.  We will schedule her for genetics appointment when she returns to see me in July  Total encounter time 35 minutes.Chauncey Cruel, MD   12/11/2019 10:34 AM Medical Oncology and Hematology [facility] [place], [place] [address] Tel. [phone]    Fax. [phone]   This document serves as a record of services personally performed by Lurline Del, MD. It was created on his behalf by Wilburn Mylar, a trained medical scribe. The creation of this record is based on the scribe's personal  observations and the provider's statements to them.   I, Lurline Del MD, have reviewed the above documentation for accuracy and completeness, and I agree with the above.   *Total Encounter Time as defined by the Centers for Medicare and Medicaid Services includes, in addition to the face-to-face time of a patient visit (documented in the note above) non-face-to-face time: obtaining and reviewing outside history, ordering and reviewing medications, tests or procedures, care coordination (communications with other health care professionals or caregivers) and documentation in the medical record.

## 2019-12-11 ENCOUNTER — Ambulatory Visit
Admission: RE | Admit: 2019-12-11 | Discharge: 2019-12-11 | Disposition: A | Discharge status: Home / Self Care | Payer: Medicare Other | Source: Ambulatory Visit | Attending: Radiation Oncology | Admitting: Radiation Oncology

## 2019-12-11 ENCOUNTER — Encounter: Payer: Self-pay | Admitting: Radiation Oncology

## 2019-12-11 ENCOUNTER — Inpatient Hospital Stay: Payer: Medicare Other | Attending: Oncology | Admitting: Oncology

## 2019-12-11 ENCOUNTER — Other Ambulatory Visit: Payer: Self-pay

## 2019-12-11 VITALS — BP 156/86 | HR 66 | Temp 97.8°F | Resp 18 | Ht 64.0 in | Wt 117.1 lb

## 2019-12-11 DIAGNOSIS — Z85828 Personal history of other malignant neoplasm of skin: Secondary | ICD-10-CM | POA: Diagnosis not present

## 2019-12-11 DIAGNOSIS — Z888 Allergy status to other drugs, medicaments and biological substances status: Secondary | ICD-10-CM | POA: Insufficient documentation

## 2019-12-11 DIAGNOSIS — Z79899 Other long term (current) drug therapy: Secondary | ICD-10-CM | POA: Insufficient documentation

## 2019-12-11 DIAGNOSIS — R232 Flushing: Secondary | ICD-10-CM | POA: Insufficient documentation

## 2019-12-11 DIAGNOSIS — Z170 Estrogen receptor positive status [ER+]: Secondary | ICD-10-CM | POA: Insufficient documentation

## 2019-12-11 DIAGNOSIS — M2550 Pain in unspecified joint: Secondary | ICD-10-CM | POA: Diagnosis not present

## 2019-12-11 DIAGNOSIS — Z923 Personal history of irradiation: Secondary | ICD-10-CM | POA: Diagnosis not present

## 2019-12-11 DIAGNOSIS — Z8249 Family history of ischemic heart disease and other diseases of the circulatory system: Secondary | ICD-10-CM | POA: Insufficient documentation

## 2019-12-11 DIAGNOSIS — C50411 Malignant neoplasm of upper-outer quadrant of right female breast: Secondary | ICD-10-CM | POA: Diagnosis not present

## 2019-12-11 DIAGNOSIS — M797 Fibromyalgia: Secondary | ICD-10-CM | POA: Insufficient documentation

## 2019-12-11 DIAGNOSIS — E7800 Pure hypercholesterolemia, unspecified: Secondary | ICD-10-CM | POA: Diagnosis not present

## 2019-12-11 DIAGNOSIS — F329 Major depressive disorder, single episode, unspecified: Secondary | ICD-10-CM | POA: Insufficient documentation

## 2019-12-11 DIAGNOSIS — Z79811 Long term (current) use of aromatase inhibitors: Secondary | ICD-10-CM | POA: Insufficient documentation

## 2019-12-11 DIAGNOSIS — Z885 Allergy status to narcotic agent status: Secondary | ICD-10-CM | POA: Insufficient documentation

## 2019-12-11 DIAGNOSIS — Z510 Encounter for antineoplastic radiation therapy: Secondary | ICD-10-CM | POA: Diagnosis not present

## 2019-12-11 DIAGNOSIS — R531 Weakness: Secondary | ICD-10-CM | POA: Insufficient documentation

## 2019-12-11 DIAGNOSIS — Z8371 Family history of colonic polyps: Secondary | ICD-10-CM | POA: Insufficient documentation

## 2019-12-11 DIAGNOSIS — I10 Essential (primary) hypertension: Secondary | ICD-10-CM | POA: Insufficient documentation

## 2019-12-11 DIAGNOSIS — Z882 Allergy status to sulfonamides status: Secondary | ICD-10-CM | POA: Insufficient documentation

## 2019-12-11 MED ORDER — ANASTROZOLE 1 MG PO TABS: 1.0000 mg | ORAL_TABLET | Freq: Every day | ORAL | 4 refills | Status: DC

## 2019-12-15 ENCOUNTER — Telehealth: Payer: Self-pay | Admitting: Oncology

## 2019-12-15 NOTE — Telephone Encounter (Signed)
Scheduled appts per 4/2 los. Pt confirmed office visit and lab visit. Pt stated that she didn't want me to schedule the genetics appt right now. Pt asked me to call her back later today to schedule that and stated she would like to have it on a different day than labs and office visit.

## 2019-12-16 ENCOUNTER — Telehealth: Payer: Self-pay | Admitting: *Deleted

## 2019-12-16 NOTE — Telephone Encounter (Signed)
RETURNED PATIENT'S PHONE CALL, SPOKE WITH PATIENT. ?

## 2019-12-17 ENCOUNTER — Telehealth: Payer: Self-pay | Admitting: Oncology

## 2019-12-17 NOTE — Telephone Encounter (Signed)
Scheduled genetic counseling appt per 4/2 los. Pt requested appt date and time.

## 2019-12-23 DIAGNOSIS — R202 Paresthesia of skin: Secondary | ICD-10-CM | POA: Diagnosis not present

## 2019-12-23 DIAGNOSIS — Z0000 Encounter for general adult medical examination without abnormal findings: Secondary | ICD-10-CM | POA: Diagnosis not present

## 2019-12-23 DIAGNOSIS — R7309 Other abnormal glucose: Secondary | ICD-10-CM | POA: Diagnosis not present

## 2019-12-23 DIAGNOSIS — Z1389 Encounter for screening for other disorder: Secondary | ICD-10-CM | POA: Diagnosis not present

## 2019-12-23 DIAGNOSIS — K589 Irritable bowel syndrome without diarrhea: Secondary | ICD-10-CM | POA: Diagnosis not present

## 2019-12-23 DIAGNOSIS — E7800 Pure hypercholesterolemia, unspecified: Secondary | ICD-10-CM | POA: Diagnosis not present

## 2019-12-23 DIAGNOSIS — M810 Age-related osteoporosis without current pathological fracture: Secondary | ICD-10-CM | POA: Diagnosis not present

## 2019-12-23 DIAGNOSIS — I10 Essential (primary) hypertension: Secondary | ICD-10-CM | POA: Diagnosis not present

## 2019-12-23 DIAGNOSIS — C50919 Malignant neoplasm of unspecified site of unspecified female breast: Secondary | ICD-10-CM | POA: Diagnosis not present

## 2019-12-25 DIAGNOSIS — Z961 Presence of intraocular lens: Secondary | ICD-10-CM | POA: Diagnosis not present

## 2019-12-25 DIAGNOSIS — H2512 Age-related nuclear cataract, left eye: Secondary | ICD-10-CM | POA: Diagnosis not present

## 2019-12-25 DIAGNOSIS — H2511 Age-related nuclear cataract, right eye: Secondary | ICD-10-CM | POA: Diagnosis not present

## 2019-12-28 NOTE — Progress Notes (Signed)
  Patient Name: Vanessa Shaffer MRN: [id number] DOB: 1941-07-09 Referring Physician: [name] (Profile Not Attached) Date of Service: 12/23/2019 [facility] [place]                                                        End Of Treatment Note  Diagnoses: C50.411-Malignant neoplasm of upper-outer quadrant of right female breast  Cancer Staging: Cancer Staging Malignant neoplasm of upper-outer quadrant of right breast in female, estrogen receptor positive ([place]) Staging form: Breast, AJCC 8th Edition - Clinical stage from 10/07/2019: Stage IA (cT1c, cN0, cM0, G1, ER+, PR+, HER2-) - Signed by Gardenia Phlegm, NP on 10/14/2019 - Pathologic stage from 10/23/2019: Stage Unknown (pT2, pNX, G2, ER+, PR+, HER2-) - Signed by Eppie Gibson, MD on 10/23/2019  Intent: Curative  Radiation Treatment Dates: 11/16/2019 through 12/11/2019 Site Technique Total Dose (Gy) Dose per Fx (Gy) Completed Fx Beam Energies  Breast, Right: Breast_Rt_Axilla 3D 40.05/40.05 2.67 15/15 6X, 10X  Breast, Right: Breast_Rt_Bst 3D 10/10 2 5/5 6X, 10X   Narrative: The patient tolerated radiation therapy relatively well.   Plan: The patient will follow-up with radiation oncology in 1 mo, or as needed. -----------------------------------  Eppie Gibson, MD

## 2020-01-11 NOTE — Progress Notes (Signed)
[place]  Telephone:([phone] Fax:([phone]) [phone]     ID: Vanessa Shaffer DOB: September 05, 1941  MR#: [id number]  QBH#:[id number]  Patient Care Team: [name], MD as PCP - General (Internal Medicine) Armandina Gemma, MD as Consulting Physician (General Surgery) Mauro Kaufmann, RN as Oncology Nurse Navigator Rockwell Germany, RN as Oncology Nurse Navigator Eppie Gibson, MD as Consulting Physician (Radiation Oncology) Shanekia Latella, Virgie Dad, MD as Consulting Physician (Oncology) Princess Bruins, MD as Consulting Physician (Obstetrics and Gynecology) [facility], Jennefer Bravo, MD as Referring Physician (Dermatology) Ronald Lobo, MD as Consulting Physician (Gastroenterology) Marica Otter, OD (Optometry) Chauncey Cruel, MD OTHER MD:  CHIEF COMPLAINT: Estrogen receptor positive breast cancer  CURRENT TREATMENT:  anastrozole   INTERVAL HISTORY: Vanessa Shaffer returns today for follow up of her estrogen receptor positive breast cancer.   She began anastrozole on 12/29/2019.  She is tolerating it remarkably well, with no hot flashes or vaginal dryness.  She is obtaining it at a good price.  She is scheduled for genetic counseling on 03/30/2020. I will see her again before that visit on 03/23/2020.  She had both her Pfizer vaccine doses by early February.   REVIEW OF SYSTEMS: Vanessa Shaffer is very concerned because of the initial difficulty in diagnosing her breast cancer.  This makes her anxious and makes her wonder what all the various tests she has had mean.  She wanted to go over those again which I was glad to do.  In addition she has developed a discomfort, not a pain, and the muscle/skin portion of the right thigh laterally.  This is the area where the lateral cutaneous femoral nerve can be found and frequently gets compressed or injured.  The pain, or other discomfort, is intermittent and very mild.  It is not ongoing at this point.  A detailed review of systems today was  otherwise stable.   HISTORY OF CURRENT ILLNESS: From the original intake note:  Vanessa Shaffer initially noted a palpable upper-outer right breast lump in 12/2014. Nothing was seen on imaging at the time--we reviewed her mammograms and ultrasounds back to 2015 today--and she continued on annual mammography as scheduled until 07/30/2019.  At that time she underwent routine bilateral diagnostic mammography with tomography and right breast ultrasonography at [facility] showing: breast density category B; 2 cm irregular mass in the right breast at 10 o'clock. Right axilla was imaged by ultrasound on 07/30/2019 and was normal (information per Dr. Gareth Eagle email dated 10/24/2019).  Accordingly on 09/23/2019 Vanessa Shaffer proceeded to biopsy of the right breast area in question. The pathology from this procedure ([id number]) showed: benign breast tissue.  This was felt to be discordant and repeat biopsy was performed on 10/07/2019 showing ([id number]): invasive ductal carcinoma, grade 1 or 2; ductal carcinoma in situ. Prognostic indicators significant for: estrogen receptor, 100% positive with strong staining intensity, and progesterone receptor, 60% positive with weak staining intensity. Proliferation marker Ki67 at 5%. HER2 negative by immunohistochemistry (1+).  She opted to proceed with right lumpectomy on 10/20/2019 under Dr. Harlow Asa. 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   Dr. Harlow Asa has commented on the margin issue: "While the pathologist is calling positive and close margins, there is really not any additional tissue to take. The anterior margin would be subcutaneous tissue, of which little was left. The lateral margin likewise is abutted by subcutaneous adipose tissue  of which little was left. I therefore do not think re-excision  is an option. I guess we could ultrasound to see if there is any sign of residual breast tissue anteriorly or laterally that could be removed."  The case was reviewed radiologically by Dr. Isaiah Blakes and on her 10/24/2019 email she states "I am confirming my opinion that if both clips were well located on specimen radiograph that no additional imaging would help [place] with margin status."  The patient's subsequent history is as detailed below.   PAST MEDICAL HISTORY: Past Medical History:  Diagnosis Date  . Breast cancer ([place])   . 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 Elevated cholesterol   . Hypertension   . Leiomyoma   . 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 BASAL CELL CARCINOMA EXCISION    . BREAST SURGERY  1975   Cyst  . MASTECTOMY, PARTIAL Right 10/20/2019   Procedure: RIGHT PARTIAL MASTECTOMY;  Surgeon: Armandina Gemma, MD;  Location: [facility];  Service: General;  Laterality: Right;  . THYROID SURGERY  1975   Cyst    FAMILY HISTORY: Family History  Problem Relation Age of Onset  . Hypertension Mother   . Heart disease Father   . Colon polyps Father   . Colon polyps Brother    The patient's father died from congestive heart failure in his late 31s.  The patient's mother died at age 63.  The family is of Ashkenazi descent.  The maternal grandmother had "blood cancer".  There are however no other cancers in the family to the patient's knowledge   GYNECOLOGIC HISTORY:  No LMP recorded. Patient is postmenopausal. Menarche: 79 years old Age at first live birth: 79 years old [place] P 2 LMP late 45s Contraceptive: Oral contraceptives for some years with no complications HRT "a few years"  Hysterectomy? no BSO? 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  Her husband Vanessa Shaffer is an attorney dealing with           cases.  Daughter Vanessa Shaffer,, 31, is a  Secondary school teacher in [place]; daughter Vanessa Shaffer is a Pharmacist, hospital in [place] (her husband is an Magazine features editor at [employer]).  The patient has 4 grandchildren, one in [place] and 3 in [place].  She attends [employer]   ADVANCED DIRECTIVES: In the absence of any documents to the contrary the patient's husband is her healthcare power of attorney   HEALTH MAINTENANCE: Social History   Tobacco Use  . Smoking status: Never Smoker  . Smokeless tobacco: Never Used  Substance Use Topics  . Alcohol use: Yes    Alcohol/week: 0.0 standard drinks    Comment: rare  . Drug use: No     Colonoscopy: 2019/[facility]  PAP: 07/2019, negative  Bone density: 07/2018, -2.7   Allergies  Allergen Reactions  . Sulfa Antibiotics Hives  . Terazol [Terconazole] Itching  . Ambien [Zolpidem Tartrate] Palpitations  . Codeine Palpitations    Current Outpatient Medications  Medication Sig Dispense Refill  . acetaminophen (TYLENOL) 500 MG tablet Take 1,000 mg by mouth every 6 (six) hours as needed for moderate pain.    Marland Kitchen anastrozole (ARIMIDEX) 1 MG tablet Take 1 tablet (1 mg total) by mouth daily. 90 tablet 4  . aspirin 81 MG tablet Take 81 mg by mouth daily. Takes about twice weekly    .  atorvastatin (LIPITOR) 10 MG tablet Take 10 mg by mouth daily.    . Calcium Carbonate-Vitamin D (CALCIUM + D PO) Take by mouth daily.      Marland Kitchen ibuprofen (ADVIL,MOTRIN) 200 MG tablet Take 400-600 mg by mouth every 6 (six) hours as needed for moderate pain.    . metoprolol succinate (TOPROL-XL) 50 MG 24 hr tablet TAKE 1 TABLET ONCE DAILY. 30 tablet 1   No current facility-administered medications for this visit.    OBJECTIVE: white woman who appears younger than stated age  1:   01/12/20 0955  BP: (!) 149/71  Pulse: 70  Resp: 18  Temp: 98.3 F (36.8 C)  SpO2: 100%     Body mass index is 20.24 kg/m.   Wt Readings from Last 3 Encounters:  01/12/20 117 lb 14.4 oz (53.5 kg)  12/11/19 117 lb 1.6 oz (53.1  kg)  11/02/19 116 lb 8 oz (52.8 kg)      ECOG FS:1 - Symptomatic but completely ambulatory  Sclerae unicteric, EOMs intact Wearing a mask No cervical or supraclavicular adenopathy Lungs no rales or rhonchi Heart regular rate and rhythm Abd soft, nontender, positive bowel sounds MSK no focal spinal tenderness, no upper extremity lymphedema Neuro: nonfocal, well oriented, appropriate affect Breasts: The right breast is status post lumpectomy and radiation.  There is minimal residual hyperpigmentation.  The cosmetic result is excellent.  There is no evidence of residual or recurrent disease.  The left breast is benign.  Both axillae are benign   LAB RESULTS:  CMP     Component Value Date/Time   NA 141 11/02/2019 1531   K 4.0 11/02/2019 1531   CL 104 11/02/2019 1531   CO2 28 11/02/2019 1531   GLUCOSE 124 (H) 11/02/2019 1531   BUN 21 11/02/2019 1531   CREATININE 0.92 11/02/2019 1531   CALCIUM 9.0 11/02/2019 1531   PROT 7.0 11/02/2019 1531   ALBUMIN 3.9 11/02/2019 1531   AST 15 11/02/2019 1531   ALT 17 11/02/2019 1531   ALKPHOS 85 11/02/2019 1531   BILITOT 0.3 11/02/2019 1531   GFRNONAA 60 (L) 11/02/2019 1531   GFRAA >60 11/02/2019 1531    No results found for: TOTALPROTELP, ALBUMINELP, A1GS, A2GS, BETS, BETA2SER, GAMS, MSPIKE, SPEI  Lab Results  Component Value Date   WBC 16.3 (H) 11/02/2019   NEUTROABS 5.5 11/02/2019   HGB 12.8 11/02/2019   HCT 39.1 11/02/2019   MCV 97.8 11/02/2019   PLT 218 11/02/2019    No results found for: LABCA2  No components found for: ZOXWRU045  No results for input(s): INR in the last 168 hours.  No results found for: LABCA2  No results found for: WUJ811  No results found for: BJY782  No results found for: NFA213  No results found for: CA2729  No components found for: HGQUANT  No results found for: CEA1 / No results found for: CEA1   No results found for: AFPTUMOR  No results found for: CHROMOGRNA  No results found for:  KPAFRELGTCHN, LAMBDASER, KAPLAMBRATIO (kappa/lambda light chains)  No results found for: HGBA, HGBA2QUANT, HGBFQUANT, HGBSQUAN (Hemoglobinopathy evaluation)   No results found for: LDH  No results found for: IRON, TIBC, IRONPCTSAT (Iron and TIBC)  No results found for: FERRITIN  Urinalysis    Component Value Date/Time   COLORURINE YELLOW 12/15/2014 1037   APPEARANCEUR CLEAR 12/15/2014 1037   LABSPEC 1.013 12/15/2014 1037   PHURINE 6.5 12/15/2014 1037   GLUCOSEU NEG 12/15/2014 1037   HGBUR NEG 12/15/2014  [place] 12/15/2014 1037   KETONESUR NEG 12/15/2014 1037   PROTEINUR NEG 12/15/2014 1037   UROBILINOGEN 0.2 12/15/2014 1037   NITRITE NEG 12/15/2014 1037   LEUKOCYTESUR NEG 12/15/2014 1037    STUDIES: No results found.   ELIGIBLE FOR AVAILABLE RESEARCH PROTOCOL: AET (at start of antiestrogens).  ASSESSMENT: 79 y.o. [place] woman status post right breast upper outer quadrant biopsy 10/07/2019 for a clinical T1c N0 invasive ductal carcinoma, grade 1 or 2, estrogen and progesterone receptor positive, HER-2 not amplified, with an MIB-1 of 5%  (1) status post right lumpectomy without sentinel lymph node sampling 10/20/2019 for a T2 NX, stage IB invasive ductal carcinoma, grade 2, with a focally positive anterior margin  (a) per surgeon: no residual breast tissue at anterior margin; no further surgery indicated  (2) Oncotype score of 19 predicts a risk of recurrence outside the breast over the next 9 years of 6% if the patient's only systemic therapy is an antiestrogen for 5 years.  It also predicts no benefit from chemotherapy.  (3) adjuvant radiation 11/16/2019 through 12/11/2019 Site Technique Total Dose (Gy) Dose per Fx (Gy) Completed Fx Beam Energies  Breast, Right: Breast_Rt_Axilla 3D 40.05/40.05 2.67 15/15 6X, 10X  Breast, Right: Breast_Rt_Bst 3D 10/10 2 5/5 6X, 10X   (4) start anastrozole 12/29/2019  (a) bone density 07/30/2018 at [facility]  gynecology shows osteoporosis  (5) genetics testing pending   PLAN: Vanessa Shaffer is tolerating anastrozole remarkably well and the plan will be to continue that a minimum of 5 years.  She had some questions regarding her Oncotype results and we discussed that at length and clarified those.  She is having insomnia she says because of anxiety.  She finds that melatonin and Unisom are helping.  There is nothing wrong with those.  She can also try Benadryl if she wishes.  Exercise and meditation can also be helpful  The discomfort she has intermittently in her outer upper right thigh may be due to a minor bump or perhaps to a very tight belt.  I reassured her that this is not going to be breast cancer, which does not go to muscle and does not cause intermittent but rather constant and relentless pain.  Nevertheless we are obtaining a plain film of the right hip today which I think will be reassuring to her.  The 1 concern is her osteoporosis.  Anastrozole can worsen that.  She tells me she took Fosamax a long time ago and did not do well with it for some reason that she cannot quite remember.  We discussed denosumab/Prolia.  After much discussion she decided she would like to give ibandronate a try.  We did discuss the possible toxicities side effects and complications 6 including the rare cases of osteonecrosis of the jaw.  I have gone in and put in the prescription for her.  She will see me again in 2 months.  If she is tolerating the ibandronate well we will likely broaden the follow-up interval at that time.  She is also scheduled for genetics testing in July and will need a repeat bone density in November of this year  Total encounter time 35 minutes.Chauncey Cruel, MD   01/12/2020 10:10 AM Medical Oncology and Hematology [facility] [place], [place] [address] Tel. [phone]    Fax. [phone]   This document serves as a record of services personally  performed by Lurline Del, MD. It was created on  his behalf by Wilburn Mylar, a trained medical scribe. The creation of this record is based on the scribe's personal observations and the provider's statements to them.   I, Lurline Del MD, have reviewed the above documentation for accuracy and completeness, and I agree with the above.   *Total Encounter Time as defined by the Centers for Medicare and Medicaid Services includes, in addition to the face-to-face time of a patient visit (documented in the note above) non-face-to-face time: obtaining and reviewing outside history, ordering and reviewing medications, tests or procedures, care coordination (communications with other health care professionals or caregivers) and documentation in the medical record.

## 2020-01-12 ENCOUNTER — Other Ambulatory Visit: Payer: Self-pay

## 2020-01-12 ENCOUNTER — Inpatient Hospital Stay: Payer: Medicare Other | Attending: Oncology | Admitting: Oncology

## 2020-01-12 VITALS — BP 149/71 | HR 70 | Temp 98.3°F | Resp 18 | Ht 64.0 in | Wt 117.9 lb

## 2020-01-12 DIAGNOSIS — Z885 Allergy status to narcotic agent status: Secondary | ICD-10-CM | POA: Diagnosis not present

## 2020-01-12 DIAGNOSIS — Z8371 Family history of colonic polyps: Secondary | ICD-10-CM | POA: Diagnosis not present

## 2020-01-12 DIAGNOSIS — Z79811 Long term (current) use of aromatase inhibitors: Secondary | ICD-10-CM | POA: Diagnosis not present

## 2020-01-12 DIAGNOSIS — Z8249 Family history of ischemic heart disease and other diseases of the circulatory system: Secondary | ICD-10-CM | POA: Diagnosis not present

## 2020-01-12 DIAGNOSIS — Z888 Allergy status to other drugs, medicaments and biological substances status: Secondary | ICD-10-CM | POA: Insufficient documentation

## 2020-01-12 DIAGNOSIS — Z79899 Other long term (current) drug therapy: Secondary | ICD-10-CM | POA: Diagnosis not present

## 2020-01-12 DIAGNOSIS — Z882 Allergy status to sulfonamides status: Secondary | ICD-10-CM | POA: Diagnosis not present

## 2020-01-12 DIAGNOSIS — C50411 Malignant neoplasm of upper-outer quadrant of right female breast: Secondary | ICD-10-CM | POA: Diagnosis not present

## 2020-01-12 DIAGNOSIS — I10 Essential (primary) hypertension: Secondary | ICD-10-CM | POA: Insufficient documentation

## 2020-01-12 DIAGNOSIS — E7800 Pure hypercholesterolemia, unspecified: Secondary | ICD-10-CM | POA: Insufficient documentation

## 2020-01-12 DIAGNOSIS — Z85828 Personal history of other malignant neoplasm of skin: Secondary | ICD-10-CM | POA: Insufficient documentation

## 2020-01-12 DIAGNOSIS — F419 Anxiety disorder, unspecified: Secondary | ICD-10-CM | POA: Diagnosis not present

## 2020-01-12 DIAGNOSIS — G4700 Insomnia, unspecified: Secondary | ICD-10-CM | POA: Insufficient documentation

## 2020-01-12 DIAGNOSIS — Z170 Estrogen receptor positive status [ER+]: Secondary | ICD-10-CM | POA: Diagnosis not present

## 2020-01-12 MED ORDER — IBANDRONATE SODIUM 150 MG PO TABS: 150.0000 mg | ORAL_TABLET | ORAL | 4 refills | Status: DC

## 2020-01-13 ENCOUNTER — Telehealth: Payer: Self-pay | Admitting: Oncology

## 2020-01-13 ENCOUNTER — Encounter: Payer: Self-pay | Admitting: Oncology

## 2020-01-13 ENCOUNTER — Telehealth: Payer: Self-pay

## 2020-01-13 ENCOUNTER — Ambulatory Visit: Payer: Self-pay | Admitting: Radiation Oncology

## 2020-01-13 ENCOUNTER — Ambulatory Visit (HOSPITAL_COMMUNITY)
Admission: RE | Admit: 2020-01-13 | Discharge: 2020-01-13 | Disposition: A | Discharge status: Home / Self Care | Payer: Medicare Other | Source: Ambulatory Visit | Attending: Oncology | Admitting: Oncology

## 2020-01-13 DIAGNOSIS — M25551 Pain in right hip: Secondary | ICD-10-CM | POA: Diagnosis not present

## 2020-01-13 DIAGNOSIS — C50411 Malignant neoplasm of upper-outer quadrant of right female breast: Secondary | ICD-10-CM | POA: Diagnosis not present

## 2020-01-13 DIAGNOSIS — Z170 Estrogen receptor positive status [ER+]: Secondary | ICD-10-CM | POA: Insufficient documentation

## 2020-01-13 NOTE — Telephone Encounter (Signed)
No changes made to pt's schedule per 5/4 los.

## 2020-01-13 NOTE — Telephone Encounter (Signed)
-----   Message from Eppie Gibson, MD sent at 01/13/2020  2:29 PM EDT ----- Regarding: RE: Resume wearing bra with underwire Yes, once skin has healed (ie a month later) that's fine as long as it's not painful.  Thanks ----- Message ----- From: Zola Button, RN Sent: 01/13/2020   1:24 PM EDT To: Eppie Gibson, MD Subject: Resume wearing bra with [place]              Afternoon! Mrs. Borsuk called me back after her follow-up call just to check and see if it would be alright for her to resume wearing a bra with underwire in it occasionally. She said she'll still predominately wear a sports bra but every once in awhile would like something more traditional.   These seems like a good thing to know in general so I have a response for other patients who may be curious as well :)  Thanks! Katelin

## 2020-01-13 NOTE — Telephone Encounter (Signed)
I called the patient today about her upcoming follow-up appointment in radiation oncology.   Given the state of the COVID-19 pandemic, concerning case numbers in our community, and guidance from [facility], I offered a phone assessment with the patient to determine if coming to the clinic was necessary. She accepted.  I let the patient know that I had spoken with Dr. Isidore Moos, and she wanted them to know the importance of washing their hands for at least 20 seconds at a time, especially after going out in public, and before they eat.  Limit going out in public whenever possible. Do not touch your face, unless your hands are clean, such as when bathing. Get plenty of rest, eat well, and stay hydrated. Patient verbalized understanding and agreement.  The patient denies any symptomatic concerns.  Specifically, they report good healing of their skin in the radiation fields. She saw Dr. Jana Hakim yesterday and he examined her skin and told her he thought it was healing very well. I recommended that she continue skin care by applying oil or lotion with vitamin E to the skin in the radiation fields, BID, for 2 more months.  She denied any shortness of breath, chest pain, fatigue, breast pain, issues with range of motion to the right arm, or swelling to right breast/arm. She did report some tingling/pain to her right hip which she brought up during her appointment with Dr. Jana Hakim yesterday. She told me he believed it to be a pinched nerve, but he ordered an x-ray of her hip today just to ease her mind. Overall she reports she feels like she is doing very well, and was very appreciative of the care Dr. Isidore Moos and her team provided.   Continue follow-up with medical oncology - follow-up is scheduled on 03/23/2020 with Dr. Jana Hakim .  I explained that yearly mammograms are important for patients with intact breast tissue, and physical exams are important after mastectomy for patients that cannot undergo  mammography. Patient verbalized understanding and agreement.  I encouraged her to call if she had further questions or concerns about her healing. Otherwise, she will follow-up PRN in radiation oncology. Patient is pleased with this plan, and we will cancel her upcoming follow-up to reduce the risk of COVID-19 transmission.

## 2020-01-13 NOTE — Telephone Encounter (Signed)
Returned patient's call to let her know that per Dr. Isidore Moos: it is fine to resume wearing a bar with underwire in it as long as her skin is fully healed and it is not painful. Patient verbalized understanding and agreement, and denied any other needs at this time.

## 2020-01-15 ENCOUNTER — Ambulatory Visit: Payer: Self-pay | Admitting: Radiation Oncology

## 2020-01-15 DIAGNOSIS — Z961 Presence of intraocular lens: Secondary | ICD-10-CM | POA: Diagnosis not present

## 2020-01-15 DIAGNOSIS — H2511 Age-related nuclear cataract, right eye: Secondary | ICD-10-CM | POA: Diagnosis not present

## 2020-01-18 ENCOUNTER — Telehealth: Payer: Self-pay | Admitting: *Deleted

## 2020-01-18 NOTE — Telephone Encounter (Signed)
This RN spoke with the patient per her follow up call from a my chart message regarding results of hip films showing not active issues - she wanted to " make sure the area in my thigh was evaluated "  This RN reviewed MD dictation noting area in thigh relating to compression by the femoral head - informed pt area of concern was noted with normal reading- which means overall bones look good and likely she overstrained a muscle in her thigh.  Presently pain is not present " but just have had so many scares ".  She also states she picked up the [facility] and " read all the side effects including nausea, diarrhea and flu like symptoms "  " I am just not sure about taking a drug with these side effects - I am very active and just cannot feel so bad "  She asked about why this type of medication recommended vs just her supplements. This RN discussed the specifics of how a bisphosphonate works unlike supplements.  This RN discussed above - and that unfortunately people need to be aware of potential side effects but that overall patients tolerate the boniva very well - especially if they are active.  Mavery states she will take the medication and call if symptom occur.

## 2020-01-27 DIAGNOSIS — Z9011 Acquired absence of right breast and nipple: Secondary | ICD-10-CM | POA: Diagnosis not present

## 2020-01-27 DIAGNOSIS — C50411 Malignant neoplasm of upper-outer quadrant of right female breast: Secondary | ICD-10-CM | POA: Diagnosis not present

## 2020-03-22 NOTE — Progress Notes (Signed)
[place]  [phone] [phone]     ID: Vanessa Shaffer DOB: [birth year]  MR#: [id number]  WCB#:[id number]  Patient Care Team: [name], MD as PCP - General (Internal Medicine) Armandina Gemma, MD as Consulting Physician (General Surgery) Mauro Kaufmann, RN as Oncology Nurse Navigator Rockwell Germany, RN as Oncology Nurse Navigator Eppie Gibson, MD as Consulting Physician (Radiation Oncology) Helios Kohlmann, Virgie Dad, MD as Consulting Physician (Oncology) Princess Bruins, MD as Consulting Physician (Obstetrics and Gynecology) [facility], Jennefer Bravo, MD as Referring Physician (Dermatology) Ronald Lobo, MD as Consulting Physician (Gastroenterology) Marica Otter, OD (Optometry) Chauncey Cruel, MD OTHER MD:  CHIEF COMPLAINT: Estrogen receptor positive breast cancer  CURRENT TREATMENT:  Anastrozole,   INTERVAL HISTORY: Vanessa Shaffer returns today for follow-up and treatment of her estrogen receptor positive breast cancer. She was last seen here on 01/12/2020.   She continues on anastrozole.   Vanessa Shaffer's last bone density screening on 07/30/2018, revealed osteoporosis.    Since her last visit here, she underwent hip xray on 01/13/2020 revealing minor right hip acetabular spurring without any other evidence of hip pain.    REVIEW OF SYSTEMS: Vanessa Shaffer still has a little bit of discomfort in the upper anterior lateral right thigh, superficially.  She ambulates normally and frequently walks the 18 holes when she plays golf.  She has a Clinical research associate and she goes to the gym as well.  She has had her vaccine doses without complications.  A detailed review of systems today was otherwise stable  HISTORY OF CURRENT ILLNESS: From the original intake note:  Vanessa Shaffer initially noted a palpable upper-outer right breast lump in 12/2014. Nothing was seen on imaging at the time--we reviewed her mammograms and ultrasounds back to 2015 today--and she continued  on annual mammography as scheduled until 07/30/2019.  At that time she underwent routine bilateral diagnostic mammography with tomography and right breast ultrasonography at [facility] showing: breast density category B; 2 cm irregular mass in the right breast at 10 o'clock. Right axilla was imaged by ultrasound on 07/30/2019 and was normal (information per Dr. Gareth Eagle email dated 10/24/2019).  Accordingly on 09/23/2019 [facility] proceeded to biopsy of the right breast area in question. The pathology from this procedure ([id number]) showed: benign breast tissue.  This was felt to be discordant and repeat biopsy was performed on 10/07/2019 showing ([id number]): invasive ductal carcinoma, grade 1 or 2; ductal carcinoma in situ. Prognostic indicators significant for: estrogen receptor, 100% positive with strong staining intensity, and progesterone receptor, 60% positive with weak staining intensity. Proliferation marker Ki67 at 5%. HER2 negative by immunohistochemistry (1+).  She opted to proceed with right lumpectomy on 10/20/2019 under Dr. Harlow Asa. 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   Dr. Harlow Asa has commented on the margin issue: "While the pathologist is calling positive and close margins, there is really not any additional tissue to take. The anterior margin would be subcutaneous tissue, of which little was left. The lateral margin likewise is abutted by subcutaneous adipose tissue of which little was left. I therefore do not think re-excision is an option. I guess we could ultrasound to see if there is any sign of residual breast tissue anteriorly or laterally that could be removed."  The case was reviewed radiologically by Dr. Isaiah Blakes and on her 10/24/2019 email she states "I am confirming my opinion that if both  clips were well  located on specimen radiograph that no additional imaging would help [place] with margin status."  The patient's subsequent history is as detailed below.   PAST MEDICAL HISTORY: Past Medical History:  Diagnosis Date  . Breast cancer ([place])   . 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 Elevated cholesterol   . Hypertension   . Leiomyoma   . 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 BASAL CELL CARCINOMA EXCISION    . BREAST SURGERY  1975   Cyst  . MASTECTOMY, PARTIAL Right 10/20/2019   Procedure: RIGHT PARTIAL MASTECTOMY;  Surgeon: Armandina Gemma, MD;  Location: [facility];  Service: General;  Laterality: Right;  . THYROID SURGERY  1975   Cyst    FAMILY HISTORY: Family History  Problem Relation Age of Onset  . Hypertension Mother   . Heart disease Father   . Colon polyps Father   . Colon polyps Brother    The patient's father died from congestive heart failure in his late 39s.  The patient's mother died at age 63.  The family is of Ashkenazi descent.  The maternal grandmother had "blood cancer".  There are however no other cancers in the family to the patient's knowledge   GYNECOLOGIC HISTORY:  No LMP recorded. Patient is postmenopausal. Menarche: 79 years old Age at first live birth: 79 years old [place] P 2 LMP late 83s Contraceptive: Oral contraceptives for some years with no complications HRT "a few years"  Hysterectomy? no BSO? 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  Her husband Vanessa Shaffer is an attorney dealing with           cases.  Daughter Vanessa Shaffer,, 22, is a Secondary school teacher in [place]; daughter Vanessa Shaffer is a Pharmacist, hospital in [place] (her husband is an Magazine features editor at [employer]).  The patient has 4 grandchildren, one in [place] and 3 in [place].  She attends [employer]   ADVANCED DIRECTIVES: In the absence of any documents to the  contrary the patient's husband is her healthcare power of attorney   HEALTH MAINTENANCE: Social History   Tobacco Use  . Smoking status: Never Smoker  . Smokeless tobacco: Never Used  Vaping Use  . Vaping Use: Never used  Substance Use Topics  . Alcohol use: Yes    Alcohol/week: 0.0 standard drinks    Comment: rare  . Drug use: No     Colonoscopy: 2019/[facility]ccini  PAP: 07/2019, negative  Bone density: 07/2018, -2.7   Allergies  Allergen Reactions  . Sulfa Antibiotics Hives  . Terazol [Terconazole] Itching  . Ambien [Zolpidem Tartrate] Palpitations  . Codeine Palpitations    Current Outpatient Medications  Medication Sig Dispense Refill  . acetaminophen (TYLENOL) 500 MG tablet Take 1,000 mg by mouth every 6 (six) hours as needed for moderate pain.    Marland Kitchen anastrozole (ARIMIDEX) 1 MG tablet Take 1 tablet (1 mg total) by mouth daily. 90 tablet 4  . aspirin 81 MG tablet Take 81 mg by mouth daily. Takes about twice weekly    . atorvastatin (LIPITOR) 10 MG tablet Take 10 mg by mouth daily.    . Calcium Carbonate-Vitamin D (CALCIUM + D PO) Take by mouth daily.      Marland Kitchen ibandronate (BONIVA) 150 MG tablet Take 1 tablet (150 mg total) by mouth every 30 (thirty) days. Take  in the morning with a full glass of water, on an empty stomach, and do not take anything else by mouth or lie down for the next 30 min. 3 tablet 4  . ibuprofen (ADVIL,MOTRIN) 200 MG tablet Take 400-600 mg by mouth every 6 (six) hours as needed for moderate pain.    . metoprolol succinate (TOPROL-XL) 50 MG 24 hr tablet TAKE 1 TABLET ONCE DAILY. 30 tablet 1   No current facility-administered medications for this visit.    OBJECTIVE: white woman who appears younger than stated age  27:   03/23/20 1029  BP: 135/89  Pulse: 62  Resp: 16  Temp: 98.7 F (37.1 C)  SpO2: 100%   Wt Readings from Last 3 Encounters:  03/23/20 117 lb 9.6 oz (53.3 kg)  01/12/20 117 lb 14.4 oz (53.5 kg)  12/11/19 117 lb 1.6 oz (53.1 kg)    Body mass index is 20.19 kg/m.    ECOG FS:1 - Symptomatic but completely ambulatory  Ocular: Sclerae unicteric, pupils round and equal Ear-nose-throat: Wearing a mask Lymphatic: No cervical or supraclavicular adenopathy Lungs no rales or rhonchi Heart regular rate and rhythm Abd soft, nontender, positive bowel sounds MSK no focal spinal tenderness, no joint edema Neuro: non-focal, well-oriented, appropriate affect Breasts: The right breast is status post lumpectomy and radiation.  There is mild distortion of the contour inferior laterally, with some subjacent induration, but no evidence of local recurrence.  Left breast is benign.  Both axillae are benign.  LAB RESULTS:  CMP     Component Value Date/Time   NA 141 11/02/2019 1531   K 4.0 11/02/2019 1531   CL 104 11/02/2019 1531   CO2 28 11/02/2019 1531   GLUCOSE 124 (H) 11/02/2019 1531   BUN 21 11/02/2019 1531   CREATININE 0.92 11/02/2019 1531   CALCIUM 9.0 11/02/2019 1531   PROT 7.0 11/02/2019 1531   ALBUMIN 3.9 11/02/2019 1531   AST 15 11/02/2019 1531   ALT 17 11/02/2019 1531   ALKPHOS 85 11/02/2019 1531   BILITOT 0.3 11/02/2019 1531   GFRNONAA 60 (L) 11/02/2019 1531   GFRAA >60 11/02/2019 1531    No results found for: TOTALPROTELP, ALBUMINELP, A1GS, A2GS, BETS, BETA2SER, GAMS, MSPIKE, SPEI  Lab Results  Component Value Date   WBC 9.1 03/23/2020   NEUTROABS 3.4 03/23/2020   HGB 12.4 03/23/2020   HCT 37.4 03/23/2020   MCV 96.4 03/23/2020   PLT 176 03/23/2020    No results found for: LABCA2  No components found for: [id number]  No results for input(s): INR in the last 168 hours.  No results found for: LABCA2  No results found for: JKK938  No results found for: HWE993  No results found for: ZJI967  No results found for: CA2729  No components found for: HGQUANT  No results found for: CEA1 / No results found for: CEA1   No results found for: AFPTUMOR  No results found for: CHROMOGRNA  No  results found for: KPAFRELGTCHN, LAMBDASER, KAPLAMBRATIO (kappa/lambda light chains)  No results found for: HGBA, HGBA2QUANT, HGBFQUANT, HGBSQUAN (Hemoglobinopathy evaluation)   No results found for: LDH  No results found for: IRON, TIBC, IRONPCTSAT (Iron and TIBC)  No results found for: FERRITIN  Urinalysis    Component Value Date/Time   COLORURINE YELLOW 12/15/2014 1037   APPEARANCEUR CLEAR 12/15/2014 1037   LABSPEC 1.013 12/15/2014 1037   PHURINE 6.5 12/15/2014 1037   GLUCOSEU NEG 12/15/2014 1037   HGBUR NEG 12/15/2014 1037   BILIRUBINUR NEG  12/15/2014 1037   KETONESUR NEG 12/15/2014 1037   PROTEINUR NEG 12/15/2014 1037   UROBILINOGEN 0.2 12/15/2014 1037   NITRITE NEG 12/15/2014 1037   LEUKOCYTESUR NEG 12/15/2014 1037    STUDIES: No results found.   ELIGIBLE FOR AVAILABLE RESEARCH PROTOCOL: AET (at start of antiestrogens).  ASSESSMENT: 78 y.o. [place] woman status post right breast upper outer quadrant biopsy 10/07/2019 for a clinical T1c N0 invasive ductal carcinoma, grade 1 or 2, estrogen and progesterone receptor positive, HER-2 not amplified, with an MIB-1 of 5%  (1) status post right lumpectomy without sentinel lymph node sampling 10/20/2019 for a T2 NX, stage IB invasive ductal carcinoma, grade 2, with a focally positive anterior margin  (a) per surgeon: no residual breast tissue at anterior margin; no further surgery indicated  (2) Oncotype score of 19 predicts a risk of recurrence outside the breast over the next 9 years of 6% if the patient's only systemic therapy is an antiestrogen for 5 years.  It also predicts no benefit from chemotherapy.  (3) adjuvant radiation 11/16/2019 through 12/11/2019 Site Technique Total Dose (Gy) Dose per Fx (Gy) Completed Fx Beam Energies  Breast, Right: Breast_Rt_Axilla 3D 40.05/40.05 2.67 15/15 6X, 10X  Breast, Right: Breast_Rt_Bst 3D 10/10 2 5/5 6X, 10X   (4) started anastrozole 12/29/2019  (a) bone density 07/30/2018  at [facility] gynecology shows osteoporosis  (b) ibandronate/Boniva started May 2021  (5) genetics testing 03/23/2020   PLAN: Jozey is now just about 6 months out from definitive surgery for her breast cancer, with no evidence of disease activity.  This is favorable.  She is tolerating anastrozole well.  The plan will be to continue that a total of 5 years.  She does have a history of osteoporosis.  She is now on ibandronate, with good tolerance.  The plan is to continue that as long as she is on the anastrozole.  She will be due for repeat bone density in 2 years..   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  She is going to try ice, massage, or other local treatments for this as needed.  We reviewed her recent right hip film which was negative for any evidence of cancer   We are going to repeat her mammography in February and she will see me shortly after that  She knows to call for any other issue that may develop before the next visit   Chauncey Cruel, MD   03/23/2020 10:35 AM Medical Oncology and Hematology [facility] [place], [place] [address] Tel. [phone]    Fax. [phone]   I, Jacqualyn Posey am acting as a Education administrator for Chauncey Cruel, MD.      *Total Encounter Time as defined by the Centers for Medicare and Medicaid Services includes, in addition to the face-to-face time of a patient visit (documented in the note above) non-face-to-face time: obtaining and reviewing outside history, ordering and reviewing medications, tests or procedures, care coordination (communications with other health care professionals or caregivers) and documentation in the medical record.

## 2020-03-23 ENCOUNTER — Inpatient Hospital Stay: Payer: Medicare Other | Attending: Oncology | Admitting: Oncology

## 2020-03-23 ENCOUNTER — Other Ambulatory Visit: Payer: Self-pay

## 2020-03-23 ENCOUNTER — Inpatient Hospital Stay: Payer: Medicare Other

## 2020-03-23 VITALS — BP 135/89 | HR 62 | Temp 98.7°F | Resp 16 | Ht 64.0 in | Wt 117.6 lb

## 2020-03-23 DIAGNOSIS — E7800 Pure hypercholesterolemia, unspecified: Secondary | ICD-10-CM | POA: Insufficient documentation

## 2020-03-23 DIAGNOSIS — Z8249 Family history of ischemic heart disease and other diseases of the circulatory system: Secondary | ICD-10-CM | POA: Diagnosis not present

## 2020-03-23 DIAGNOSIS — I10 Essential (primary) hypertension: Secondary | ICD-10-CM | POA: Diagnosis not present

## 2020-03-23 DIAGNOSIS — C50411 Malignant neoplasm of upper-outer quadrant of right female breast: Secondary | ICD-10-CM | POA: Diagnosis not present

## 2020-03-23 DIAGNOSIS — Z79811 Long term (current) use of aromatase inhibitors: Secondary | ICD-10-CM | POA: Diagnosis not present

## 2020-03-23 DIAGNOSIS — G629 Polyneuropathy, unspecified: Secondary | ICD-10-CM | POA: Diagnosis not present

## 2020-03-23 DIAGNOSIS — Z8371 Family history of colonic polyps: Secondary | ICD-10-CM | POA: Diagnosis not present

## 2020-03-23 DIAGNOSIS — Z888 Allergy status to other drugs, medicaments and biological substances status: Secondary | ICD-10-CM | POA: Insufficient documentation

## 2020-03-23 DIAGNOSIS — Z85828 Personal history of other malignant neoplasm of skin: Secondary | ICD-10-CM | POA: Insufficient documentation

## 2020-03-23 DIAGNOSIS — M810 Age-related osteoporosis without current pathological fracture: Secondary | ICD-10-CM | POA: Diagnosis not present

## 2020-03-23 DIAGNOSIS — Z882 Allergy status to sulfonamides status: Secondary | ICD-10-CM | POA: Diagnosis not present

## 2020-03-23 DIAGNOSIS — Z79899 Other long term (current) drug therapy: Secondary | ICD-10-CM | POA: Insufficient documentation

## 2020-03-23 DIAGNOSIS — Z170 Estrogen receptor positive status [ER+]: Secondary | ICD-10-CM | POA: Diagnosis not present

## 2020-03-23 LAB — CBC WITH DIFFERENTIAL/PLATELET
Abs Immature Granulocytes: 0.02 10*3/uL (ref 0.00–0.07)
Basophils Absolute: 0.1 10*3/uL (ref 0.0–0.1)
Basophils Relative: 1 %
Eosinophils Absolute: 0.2 10*3/uL (ref 0.0–0.5)
Eosinophils Relative: 2 %
HCT: 37.4 % (ref 36.0–46.0)
Hemoglobin: 12.4 g/dL (ref 12.0–15.0)
Immature Granulocytes: 0 %
Lymphocytes Relative: 52 %
Lymphs Abs: 4.8 10*3/uL — ABNORMAL HIGH (ref 0.7–4.0)
MCH: 32 pg (ref 26.0–34.0)
MCHC: 33.2 g/dL (ref 30.0–36.0)
MCV: 96.4 fL (ref 80.0–100.0)
Monocytes Absolute: 0.6 10*3/uL (ref 0.1–1.0)
Monocytes Relative: 7 %
Neutro Abs: 3.4 10*3/uL (ref 1.7–7.7)
Neutrophils Relative %: 38 %
Platelets: 176 10*3/uL (ref 150–400)
RBC: 3.88 MIL/uL (ref 3.87–5.11)
RDW: 13.7 % (ref 11.5–15.5)
WBC: 9.1 10*3/uL (ref 4.0–10.5)
nRBC: 0 % (ref 0.0–0.2)

## 2020-03-23 LAB — COMPREHENSIVE METABOLIC PANEL
ALT: 14 U/L (ref 0–44)
AST: 19 U/L (ref 15–41)
Albumin: 3.8 g/dL (ref 3.5–5.0)
Alkaline Phosphatase: 70 U/L (ref 38–126)
Anion gap: 7 (ref 5–15)
BUN: 20 mg/dL (ref 8–23)
CO2: 27 mmol/L (ref 22–32)
Calcium: 9.1 mg/dL (ref 8.9–10.3)
Chloride: 107 mmol/L (ref 98–111)
Creatinine, Ser: 0.95 mg/dL (ref 0.44–1.00)
GFR calc Af Amer: >60 mL/min (ref >60)
GFR calc non Af Amer: 57 mL/min — ABNORMAL LOW (ref >60)
Glucose, Bld: 87 mg/dL (ref 70–99)
Potassium: 4.4 mmol/L (ref 3.5–5.1)
Sodium: 141 mmol/L (ref 135–145)
Total Bilirubin: 0.4 mg/dL (ref 0.3–1.2)
Total Protein: 6.7 g/dL (ref 6.5–8.1)

## 2020-03-23 LAB — VITAMIN D 25 HYDROXY (VIT D DEFICIENCY, FRACTURES): Vit D, 25-Hydroxy: 38.21 ng/mL (ref 30–100)

## 2020-03-24 ENCOUNTER — Telehealth: Payer: Self-pay | Admitting: Oncology

## 2020-03-24 NOTE — Telephone Encounter (Signed)
Scheduled appts per 7/14 los. Mailed appt reminder and calendar.

## 2020-03-30 ENCOUNTER — Inpatient Hospital Stay (HOSPITAL_BASED_OUTPATIENT_CLINIC_OR_DEPARTMENT_OTHER): Payer: Medicare Other | Admitting: Genetic Counselor

## 2020-03-30 ENCOUNTER — Other Ambulatory Visit: Payer: Self-pay

## 2020-03-30 ENCOUNTER — Other Ambulatory Visit: Payer: BLUE CROSS/BLUE SHIELD

## 2020-03-30 ENCOUNTER — Encounter: Payer: Self-pay | Admitting: Genetic Counselor

## 2020-03-30 ENCOUNTER — Telehealth: Payer: Self-pay | Admitting: *Deleted

## 2020-03-30 DIAGNOSIS — Z800 Family history of malignant neoplasm of digestive organs: Secondary | ICD-10-CM

## 2020-03-30 DIAGNOSIS — C50411 Malignant neoplasm of upper-outer quadrant of right female breast: Secondary | ICD-10-CM | POA: Diagnosis not present

## 2020-03-30 DIAGNOSIS — Z170 Estrogen receptor positive status [ER+]: Secondary | ICD-10-CM

## 2020-03-30 MED ORDER — METHYLPREDNISOLONE 4 MG PO TBPK: ORAL_TABLET | ORAL | 0 refills | Status: DC

## 2020-03-30 NOTE — Progress Notes (Addendum)
REFERRING PROVIDER: Magrinat, Gustav C, MD [address] [place],  [place] [address]  PRIMARY PROVIDER:  Husain, Karrar, MD  PRIMARY REASON FOR VISIT:  1. Malignant neoplasm of upper-outer quadrant of right breast in female, estrogen receptor positive (HCC)   2. Family history of colon cancer     HISTORY OF PRESENT ILLNESS:   Vanessa Shaffer, a 79 y.o. female, was seen for a [place] cancer genetics consultation at the request of Dr. Magrinat due to a personal and family history of cancer.  Vanessa Shaffer presents to clinic today to discuss the possibility of a hereditary predisposition to cancer, genetic testing, and to further clarify her future cancer risks, as well as potential cancer risks for family members.   In January 2021, at the age of 79, Vanessa Shaffer was diagnosed with cancer of the right breast.    CANCER HISTORY:  Oncology History  Malignant neoplasm of upper-outer quadrant of right breast in female, estrogen receptor positive (HCC)  10/07/2019 Cancer Staging   Staging form: Breast, AJCC 8th Edition - Clinical stage from 10/07/2019: Stage IA (cT1c, cN0, cM0, G1, ER+, PR+, HER2-) - Signed by Causey, Lindsey Cornetto, NP on 10/14/2019   10/14/2019 Initial Diagnosis   Malignant neoplasm of upper-outer quadrant of right breast in female, estrogen receptor positive (HCC)   10/23/2019 Cancer Staging   Staging form: Breast, AJCC 8th Edition - Pathologic stage from 10/23/2019: Stage Unknown (pT2, pNX, G2, ER+, PR+, HER2-) - Signed by Squire, Sarah, MD on 10/23/2019      RISK FACTORS:  Menarche was at age 13.  First live birth at age 23.  OCP use for approximately a few  years.  Ovaries intact: yes.  Hysterectomy: no.  Menopausal status: postmenopausal.  HRT use: 2 years. Colonoscopy: yes; normal. Mammogram within the last year: yes. Number of breast biopsies: 2. Up to date with pelvic exams: yes. Any excessive radiation exposure in the past: no  Past Medical  History:  Diagnosis Date  . Breast cancer (HCC)   . 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 Elevated cholesterol   . Family history of colon cancer   . Hypertension   . Leiomyoma   . 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 BASAL CELL CARCINOMA EXCISION    . BREAST SURGERY  1975   Cyst  . MASTECTOMY, PARTIAL Right 10/20/2019   Procedure: RIGHT PARTIAL MASTECTOMY;  Surgeon: Gerkin, Todd, MD;  Location: [facility];  Service: General;  Laterality: Right;  . THYROID SURGERY  1975   Cyst    Social History   Socioeconomic History  . Marital status: Married    Spouse name: Not on file  . Number of children: Not on file  . Years of education: Not on file  . Highest education level: Not on file  Occupational History  . Not on file  Tobacco Use  . Smoking status: Never Smoker  . Smokeless tobacco: Never Used  Vaping Use  . Vaping Use: Never used  Substance and Sexual Activity  . Alcohol use: Yes    Alcohol/week: 0.0 standard drinks    Comment: rare  . Drug use: No  . Sexual activity: Yes    Birth control/protection: Post-menopausal    Comment: 1st intercourse 79 yo-Fewer than 5 partners  Other Topics Concern  . Not on file  Social History Narrative  . Not on file   Social   Determinants of Health   Financial Resource Strain:   . Difficulty of Paying Living Expenses:   Food Insecurity:   . Worried About Charity fundraiser in the Last Year:   . Arboriculturist in the Last Year:   Transportation Needs:   . Film/video editor (Medical):   Marland Kitchen Lack of Transportation (Non-Medical):   Physical Activity:   . Days of Exercise per Week:   . Minutes of Exercise per Session:   Stress:   . Feeling of Stress :   Social Connections:   . Frequency of Communication with Friends and Family:   . Frequency of Social Gatherings with Friends and Family:   . Attends Religious Services:   . Active Member of  Clubs or Organizations:   . Attends Archivist Meetings:   Marland Kitchen Marital Status:      FAMILY HISTORY:  We obtained a detailed, 4-generation family history.  Significant diagnoses are listed below: Family History  Problem Relation Age of Onset  . Hypertension Mother   . Heart disease Father   . Colon polyps Father   . Colon cancer Father 63  . Colon polyps Brother   . Bone cancer Nephew 40  . Heart disease Maternal Grandmother   . Heart disease Maternal Grandfather     The patient has two daughters who are cancer free.  She has one brother who is cancer free. He has a son who had bone cancer at age 70.  The patient's parents are deceased.    The patient's mother died at 74 years.  She had two sisters and two brothers none reported to have cancer.  The patient's maternal grandmother had a blood cancer and her grandfather died of heart disease.  The patients father had colon cancer in his 82's and died in his 29's.  He had five brothers who were cancer free. His parents died in their 30's from old age.  Vanessa Shaffer is unaware of previous family history of genetic testing for hereditary cancer risks. Patient's maternal ancestors are of [place] and [place] descent, and paternal ancestors are of [place] descent. There is no reported Ashkenazi Jewish ancestry. There is no known consanguinity.  GENETIC COUNSELING ASSESSMENT: Vanessa Shaffer is a 79 y.o. female with a personal and family history of cancer which is somewhat suggestive of a hereditary cancer syndrome and predisposition to cancer given the Ashkenazi Jewish ancestry. We, therefore, discussed and recommended the following at today's visit.   DISCUSSION: We discussed that 5 - 10% of breast cancer is hereditary, with most cases associated with BRCA mutations.  There are other genes that can be associated with hereditary breast cancer syndromes, however based on the patients' age and her Ashkenazi Jewish ancestry our greatest  concern is for BRCA mutations, and more specifically the three Ashkenazi Jewish founder mutations.  There are other founder mutations in genes that increase the risk for different cancers.  For instance, there is a founder mutation in [place] that can increase the risk for colon cancer up to 10% lifetime risk, but does NOT approach the cancer risk for other APC hereditary mutations.  We discussed that testing is beneficial for several reasons including knowing how to follow individuals after completing their treatment, identifying whether potential treatment options such as PARP inhibitors would be beneficial, and understand if other family members could be at risk for cancer and allow them to undergo genetic testing.   We reviewed the characteristics, features and inheritance patterns of  hereditary cancer syndromes. We also discussed genetic testing, including the appropriate family members to test, the process of testing, insurance coverage and turn-around-time for results. We discussed the implications of a negative, positive, carrier and/or variant of uncertain significant result. We recommended Vanessa Shaffer pursue genetic testing for the common hereditary cancer gene panel. The Common Hereditary Gene Panel offered by [facility] includes sequencing and/or deletion duplication testing of the following 48 genes: APC, ATM, AXIN2, BARD1, BMPR1A, BRCA1, BRCA2, BRIP1, CDH1, CDK4, CDKN2A (p14ARF), CDKN2A (p16INK4a), CHEK2, CTNNA1, DICER1, EPCAM (Deletion/duplication testing only), GREM1 (promoter region deletion/duplication testing only), KIT, MEN1, MLH1, MSH2, MSH3, MSH6, MUTYH, NBN, NF1, NHTL1, PALB2, PDGFRA, PMS2, POLD1, POLE, PTEN, RAD50, RAD51C, RAD51D, RNF43, SDHB, SDHC, SDHD, SMAD4, SMARCA4. STK11, TP53, TSC1, TSC2, and VHL.  The following genes were evaluated for sequence changes only: SDHA and HOXB13 c.251G>A variant only.   Based on Vanessa Shaffer's personal and family history of cancer, she meets medical  criteria for genetic testing. Despite that she meets criteria, she may still have an out of pocket cost. We discussed that if her out of pocket cost for testing is over $100, the laboratory will call and confirm whether she wants to proceed with testing.  If the out of pocket cost of testing is less than $100 she will be billed by the genetic testing laboratory.   PLAN: After considering the risks, benefits, and limitations, Vanessa Shaffer provided informed consent to pursue genetic testing.  Blood was drawn last week and sent to [facility] for analysis of the common hereditary cancer panel. Results should be available within approximately 2-3 weeks' time, at which point they will be disclosed by telephone to Vanessa Shaffer, as will any additional recommendations warranted by these results. Vanessa Shaffer will receive a summary of her genetic counseling visit and a copy of her results once available. This information will also be available in Epic.   Lastly, we encouraged Vanessa Shaffer to remain in contact with cancer genetics annually so that we can continuously update the family history and inform her of any changes in cancer genetics and testing that may be of benefit for this family.   Vanessa Shaffer's questions were answered to her satisfaction today. Our contact information was provided should additional questions or concerns arise. Thank you for the referral and allowing us to share in the care of your patient.   Karen P. Powell, MS, LCGC Licensed, Certified Genetic Counselor [email or web address] phone: [phone]  The patient was seen for a total of 40 minutes in face-to-face genetic counseling.  This patient was discussed with Drs. Magrinat, Gudena and/or Feng who agrees with the above.    _______________________________________________________________________ For Office Staff:  Number of people involved in session: 1 Was an Intern/ student involved with case: no   

## 2020-03-30 NOTE — Telephone Encounter (Signed)
This RN spoke with pt per her request while in the office for genetic counseling regarding " lab report has flags on them and what does that mean ?"  This RN reviewed recent labs including mildly elevated lymphocytes ( down from 2 months ago ), and GFR of 57 ( noted stable reading since 2015 comparison ).  Pt also states onset of poison oak on bilateral shins she likely obtained while playing golf ( she hit her ball into an area off the manicured lawn  and had to step into the thick of overgrowth )  She has been using hydrocortisone topical with minimal benefit.  Note area at 2 on one shin and 1 on other shin approximately quarter size.  Per MD review of above - prescription for medrol dosepak sent to pharmacy.

## 2020-03-30 NOTE — Progress Notes (Deleted)
REFERRING PROVIDER: Chauncey Cruel, MD [address] [place],  [place] [address]  PRIMARY PROVIDER:  [name], MD  PRIMARY REASON FOR VISIT:  1. Family history of colon cancer      HISTORY OF PRESENT ILLNESS:   Vanessa Shaffer, a 79 y.o. female, was seen for a [facility] cancer genetics consultation at the request of Dr. Jana Hakim due to a personal and family history of cancer.  Vanessa Shaffer presents to clinic today to discuss the possibility of a hereditary predisposition to cancer, genetic testing, and to further clarify her future cancer risks, as well as potential cancer risks for family members.   In January 2021, at the age of 9, Vanessa Shaffer was diagnosed with cancer of the right breast.    CANCER HISTORY:  Oncology History  Malignant neoplasm of upper-outer quadrant of right breast in female, estrogen receptor positive ([facility])  10/07/2019 Cancer Staging   Staging form: Breast, AJCC 8th Edition - Clinical stage from 10/07/2019: Stage IA (cT1c, cN0, cM0, G1, ER+, PR+, HER2-) - Signed by Gardenia Phlegm, NP on 10/14/2019   10/14/2019 Initial Diagnosis   Malignant neoplasm of upper-outer quadrant of right breast in female, estrogen receptor positive ([facility])   10/23/2019 Cancer Staging   Staging form: Breast, AJCC 8th Edition - Pathologic stage from 10/23/2019: Stage Unknown (pT2, pNX, G2, ER+, PR+, HER2-) - Signed by Eppie Gibson, MD on 10/23/2019      RISK FACTORS:  Menarche was at age 19.  First live birth at age 34.  OCP use for approximately a few  years.  Ovaries intact: yes.  Hysterectomy: no.  Menopausal status: postmenopausal.  HRT use: 2 years. Colonoscopy: yes; normal. Mammogram within the last year: yes. Number of breast biopsies: 2. Up to date with pelvic exams: yes. Any excessive radiation exposure in the past: no  Past Medical History:  Diagnosis Date  . Breast cancer ([facility])   . 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 Elevated cholesterol    . Family history of colon cancer   . Hypertension   . Leiomyoma   . 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 BASAL CELL CARCINOMA EXCISION    . BREAST SURGERY  1975   Cyst  . MASTECTOMY, PARTIAL Right 10/20/2019   Procedure: RIGHT PARTIAL MASTECTOMY;  Surgeon: Armandina Gemma, MD;  Location: [facility];  Service: General;  Laterality: Right;  . THYROID SURGERY  1975   Cyst    Social History   Socioeconomic History  . Marital status: Married    Spouse name: Not on file  . Number of children: Not on file  . Years of education: Not on file  . Highest education level: Not on file  Occupational History  . Not on file  Tobacco Use  . Smoking status: Never Smoker  . Smokeless tobacco: Never Used  Vaping Use  . Vaping Use: Never used  Substance and Sexual Activity  . Alcohol use: Yes    Alcohol/week: 0.0 standard drinks    Comment: rare  . Drug use: No  . Sexual activity: Yes    Birth control/protection: Post-menopausal    Comment: 1st intercourse 79 yo-Fewer than 5 partners  Other Topics Concern  . Not on file  Social History Narrative  . Not on file   Social Determinants of Health   Financial Resource Strain:   . Difficulty of Paying Living Expenses:  Food Insecurity:   . Worried About Programme researcher, broadcasting/film/video in the Last Year:   . Barista in the Last Year:   Transportation Needs:   . Freight forwarder (Medical):   Marland Kitchen Lack of Transportation (Non-Medical):   Physical Activity:   . Days of Exercise per Week:   . Minutes of Exercise per Session:   Stress:   . Feeling of Stress :   Social Connections:   . Frequency of Communication with Friends and Family:   . Frequency of Social Gatherings with Friends and Family:   . Attends Religious Services:   . Active Member of Clubs or Organizations:   . Attends Banker Meetings:   Marland Kitchen Marital Status:      FAMILY HISTORY:   We obtained a detailed, 4-generation family history.  Significant diagnoses are listed below: Family History  Problem Relation Age of Onset  . Hypertension Mother   . Heart disease Father   . Colon polyps Father   . Colon cancer Father 64  . Colon polyps Brother   . Bone cancer Nephew 40  . Heart disease Maternal Grandmother   . Heart disease Maternal Grandfather     The patient has two daughters who are cancer free.  She has one brother who is cancer free. He has a son who had bone cancer at age 77.  The patient's parents are deceased.    The patient's mother died at 32 years.  She had two sisters and two brothers none reported to have cancer.  The patient's maternal grandmother had a blood cancer and her grandfather died of heart disease.  The patients father had colon cancer in his 6's and died in his 50's.  He had five brothers who were cancer free. His parents died in their 75's from old age.  Vanessa Shaffer is unaware of previous family history of genetic testing for hereditary cancer risks. Patient's maternal ancestors are of [place] and [place] descent, and paternal ancestors are of [place] descent. There is no reported Ashkenazi Jewish ancestry. There is no known consanguinity.  GENETIC COUNSELING ASSESSMENT: Vanessa Shaffer is a 79 y.o. female with a personal and family history of cancer which is somewhat suggestive of a hereditary cancer syndrome and predisposition to cancer given the Ashkenazi Jewish ancestry. We, therefore, discussed and recommended the following at today's visit.   DISCUSSION: We discussed that 5 - 10% of breast cancer is hereditary, with most cases associated with BRCA mutations.  There are other genes that can be associated with hereditary breast cancer syndromes, however based on the patients' age and her Ashkenazi Jewish ancestry our greatest concern is for BRCA mutations, and more specifically the three Ashkenazi Jewish founder mutations.  There are other  founder mutations in genes that increase the risk for different cancers.  For instance, there is a founder mutation in [place] that can increase the risk for colon cancer up to 10% lifetime risk, but does NOT approach the cancer risk for other APC hereditary mutations.  We discussed that testing is beneficial for several reasons including knowing how to follow individuals after completing their treatment, identifying whether potential treatment options such as PARP inhibitors would be beneficial, and understand if other family members could be at risk for cancer and allow them to undergo genetic testing.   We reviewed the characteristics, features and inheritance patterns of hereditary cancer syndromes. We also discussed genetic testing, including the appropriate family members to test, the process of  testing, insurance coverage and turn-around-time for results. We discussed the implications of a negative, positive, carrier and/or variant of uncertain significant result. We recommended Vanessa Shaffer pursue genetic testing for the common hereditary cancer gene panel. The Common Hereditary Gene Panel offered by [facility] includes sequencing and/or deletion duplication testing of the following 48 genes: APC, ATM, AXIN2, BARD1, BMPR1A, BRCA1, BRCA2, BRIP1, CDH1, CDK4, CDKN2A (p14ARF), CDKN2A (p16INK4a), CHEK2, CTNNA1, DICER1, EPCAM (Deletion/duplication testing only), GREM1 (promoter region deletion/duplication testing only), KIT, MEN1, MLH1, MSH2, MSH3, MSH6, MUTYH, NBN, NF1, NHTL1, PALB2, PDGFRA, PMS2, POLD1, POLE, PTEN, RAD50, RAD51C, RAD51D, RNF43, SDHB, SDHC, SDHD, SMAD4, SMARCA4. STK11, TP53, TSC1, TSC2, and VHL.  The following genes were evaluated for sequence changes only: SDHA and HOXB13 c.251G>A variant only.   Based on Vanessa Shaffer's personal and family history of cancer, she meets medical criteria for genetic testing. Despite that she meets criteria, she may still have an out of pocket cost. We discussed that  if her out of pocket cost for testing is over $100, the laboratory will call and confirm whether she wants to proceed with testing.  If the out of pocket cost of testing is less than $100 she will be billed by the genetic testing laboratory.   PLAN: After considering the risks, benefits, and limitations, Vanessa Shaffer provided informed consent to pursue genetic testing and the blood sample was sent to [facility] for analysis of the common hereditary cancer panel. Results should be available within approximately 2-3 weeks' time, at which point they will be disclosed by telephone to Vanessa Shaffer, as will any additional recommendations warranted by these results. Vanessa Shaffer will receive a summary of her genetic counseling visit and a copy of her results once available. This information will also be available in Epic.   Lastly, we encouraged Vanessa Shaffer to remain in contact with cancer genetics annually so that we can continuously update the family history and inform her of any changes in cancer genetics and testing that may be of benefit for this family.   Vanessa Shaffer questions were answered to her satisfaction today. Our contact information was provided should additional questions or concerns arise. Thank you for the referral and allowing [place] to share in the care of your patient.   Trellis Guirguis P. Florene Glen, [place], [facility] Licensed, Insurance risk surveyor [email or web address] phone: [phone]  The patient was seen for a total of 40 minutes in face-to-face genetic counseling.  This patient was discussed with Drs. Magrinat, Lindi Adie and/or Burr Medico who agrees with the above.    _______________________________________________________________________ For Office Staff:  Number of people involved in session: 1 Was an Intern/ student involved with case: no

## 2020-04-04 ENCOUNTER — Encounter: Payer: Self-pay | Admitting: Genetic Counselor

## 2020-04-04 ENCOUNTER — Telehealth: Payer: Self-pay | Admitting: Genetic Counselor

## 2020-04-04 DIAGNOSIS — Z1379 Encounter for other screening for genetic and chromosomal anomalies: Secondary | ICD-10-CM | POA: Insufficient documentation

## 2020-04-04 NOTE — Telephone Encounter (Signed)
Revealed negative genetic testing.  Discussed that we do not know why she has breast cancer or why there is cancer in the family. It could be due to a different gene that we are not testing, or maybe our current technology may not be able to pick something up.  It will be important for her to keep in contact with genetics to keep up with whether additional testing may be needed. 

## 2020-04-05 ENCOUNTER — Ambulatory Visit: Payer: Self-pay | Admitting: Genetic Counselor

## 2020-04-05 DIAGNOSIS — Z1379 Encounter for other screening for genetic and chromosomal anomalies: Secondary | ICD-10-CM

## 2020-04-05 DIAGNOSIS — Z170 Estrogen receptor positive status [ER+]: Secondary | ICD-10-CM

## 2020-04-05 NOTE — Progress Notes (Signed)
HPI:  Vanessa Shaffer was previously seen in the [facility] clinic due to a personal and family history of cancer and concerns regarding a hereditary predisposition to cancer. Please refer to our prior cancer genetics clinic note for more information regarding our discussion, assessment and recommendations, at the time. Vanessa Shaffer recent genetic test results were disclosed to her, as were recommendations warranted by these results. These results and recommendations are discussed in more detail below.  CANCER HISTORY:  Oncology History  Malignant neoplasm of upper-outer quadrant of right breast in female, estrogen receptor positive ([facility])  10/07/2019 Cancer Staging   Staging form: Breast, AJCC 8th Edition - Clinical stage from 10/07/2019: Stage IA (cT1c, cN0, cM0, G1, ER+, PR+, HER2-) - Signed by Gardenia Phlegm, NP on 10/14/2019   10/14/2019 Initial Diagnosis   Malignant neoplasm of upper-outer quadrant of right breast in female, estrogen receptor positive ([facility])   10/23/2019 Cancer Staging   Staging form: Breast, AJCC 8th Edition - Pathologic stage from 10/23/2019: Stage Unknown (pT2, pNX, G2, ER+, PR+, HER2-) - Signed by Eppie Gibson, MD on 10/23/2019   04/04/2020 Genetic Testing   Negative genetic testing on the common hereditary cancer panel.  The Common Hereditary Gene Panel offered by [facility] includes sequencing and/or deletion duplication testing of the following 48 genes: APC, ATM, AXIN2, BARD1, BMPR1A, BRCA1, BRCA2, BRIP1, CDH1, CDK4, CDKN2A (p14ARF), CDKN2A (p16INK4a), CHEK2, CTNNA1, DICER1, EPCAM (Deletion/duplication testing only), GREM1 (promoter region deletion/duplication testing only), KIT, MEN1, MLH1, MSH2, MSH3, MSH6, MUTYH, NBN, NF1, NHTL1, PALB2, PDGFRA, PMS2, POLD1, POLE, PTEN, RAD50, RAD51C, RAD51D, RNF43, SDHB, SDHC, SDHD, SMAD4, SMARCA4. STK11, TP53, TSC1, TSC2, and VHL.  The following genes were evaluated for sequence changes only: SDHA and HOXB13 c.251G>A  variant only. The report date is April 04, 2020     FAMILY HISTORY:  We obtained a detailed, 4-generation family history.  Significant diagnoses are listed below: Family History  Problem Relation Age of Onset  . Hypertension Mother   . Heart disease Father   . Colon polyps Father   . Colon cancer Father 63  . Colon polyps Brother   . Bone cancer Nephew 40  . Heart disease Maternal Grandmother   . Heart disease Maternal Grandfather     The patient has two daughters who are cancer free.  She has one brother who is cancer free. He has a son who had bone cancer at age 46.  The patient's parents are deceased.    The patient's mother died at 53 years.  She had two sisters and two brothers none reported to have cancer.  The patient's maternal grandmother had a blood cancer and her grandfather died of heart disease.  The patients father had colon cancer in his 6's and died in his 77's.  He had five brothers who were cancer free. His parents died in their 72's from old age.  Vanessa Shaffer is unaware of previous family history of genetic testing for hereditary cancer risks. Patient's maternal ancestors are of [place] and [place] descent, and paternal ancestors are of [place] descent. There is no reported Ashkenazi Jewish ancestry. There is no known consanguinity.  GENETIC TEST RESULTS: Genetic testing reported out on April 04, 2020 through the common hereditary cancer panel found no pathogenic mutations. APC, ATM, AXIN2, BARD1, BMPR1A, BRCA1, BRCA2, BRIP1, CDH1, CDK4, CDKN2A, CHEK2, CTNNA1, DICER1, EPCAM, GREM1, HOXB13, KIT, MEN1, MLH1, MSH2, MSH3, MSH6, MUTYH, NBN, NF1, NTHL1, PALB2, PDGFRA, PMS2, POLD1, POLE, PTEN, RAD50, RAD51C, RAD51D, RNF43, SDHA, SDHB,  SDHC, SDHD, SMAD4, SMARCA4. STK11, TP53, TSC1, TSC2, and VHL. The test report has been scanned into EPIC and is located under the Molecular Pathology section of the Results Review tab.  A portion of the result report is included below for  reference.     We discussed with Vanessa Shaffer that because current genetic testing is not perfect, it is possible there may be a gene mutation in one of these genes that current testing cannot detect, but that chance is small.  We also discussed, that there could be another gene that has not yet been discovered, or that we have not yet tested, that is responsible for the cancer diagnoses in the family. It is also possible there is a hereditary cause for the cancer in the family that Vanessa Shaffer did not inherit and therefore was not identified in her testing.  Therefore, it is important to remain in touch with cancer genetics in the future so that we can continue to offer Vanessa Shaffer the most up to date genetic testing.   ADDITIONAL GENETIC TESTING: We discussed with Vanessa Shaffer that there are other genes that are associated with increased cancer risk that can be analyzed. Should Vanessa Shaffer wish to pursue additional genetic testing, we are happy to discuss and coordinate this testing, at any time.    CANCER SCREENING RECOMMENDATIONS: Vanessa Shaffer test result is considered negative (normal).  This means that we have not identified a hereditary cause for her personal and family history of cancer at this time. Most cancers happen by chance and this negative test suggests that her cancer may fall into this category.    While reassuring, this does not definitively rule out a hereditary predisposition to cancer. It is still possible that there could be genetic mutations that are undetectable by current technology. There could be genetic mutations in genes that have not been tested or identified to increase cancer risk.  Therefore, it is recommended she continue to follow the cancer management and screening guidelines provided by her oncology and primary healthcare provider.   An individual's cancer risk and medical management are not determined by genetic test results alone. Overall cancer risk  assessment incorporates additional factors, including personal medical history, family history, and any available genetic information that may result in a personalized plan for cancer prevention and surveillance  RECOMMENDATIONS FOR FAMILY MEMBERS:  Individuals in this family might be at some increased risk of developing cancer, over the general population risk, simply due to the family history of cancer.  We recommended women in this family have a yearly mammogram beginning at age 21, or 44 years younger than the earliest onset of cancer, an annual clinical breast exam, and perform monthly breast self-exams. Women in this family should also have a gynecological exam as recommended by their primary provider. All family members should be referred for colonoscopy starting at age 66.  FOLLOW-UP: Lastly, we discussed with Vanessa Shaffer that cancer genetics is a rapidly advancing field and it is possible that new genetic tests will be appropriate for her and/or her family members in the future. We encouraged her to remain in contact with cancer genetics on an annual basis so we can update her personal and family histories and let her know of advances in cancer genetics that may benefit this family.   Our contact number was provided. Vanessa Shaffer questions were answered to her satisfaction, and she knows she is welcome to call us at anytime with additional questions or concerns.  Roma Kayser, [place], [facility] Licensed, Certified Genetic Counselor [email or web address]

## 2020-04-25 ENCOUNTER — Telehealth: Payer: Self-pay

## 2020-04-25 NOTE — Telephone Encounter (Signed)
Pt called to inquire about whether or not she is a candidate for COVID booster, this nurse explained to pt that at this time, we are still awaiting guidelines from the [facility] about when the booster should be given to patients. As soon as [facility] receives those guidelines there will be a model built and we will begin accepting patients for boosters. Informed pt I would add her to list of pts to get in touch with when we receive more info. Pt verbalized thanks and understanding.

## 2020-04-28 DIAGNOSIS — Z23 Encounter for immunization: Secondary | ICD-10-CM | POA: Diagnosis not present

## 2020-06-14 DIAGNOSIS — Z23 Encounter for immunization: Secondary | ICD-10-CM | POA: Diagnosis not present

## 2020-07-06 DIAGNOSIS — M810 Age-related osteoporosis without current pathological fracture: Secondary | ICD-10-CM | POA: Diagnosis not present

## 2020-07-06 DIAGNOSIS — R7303 Prediabetes: Secondary | ICD-10-CM | POA: Diagnosis not present

## 2020-07-06 DIAGNOSIS — N183 Chronic kidney disease, stage 3 unspecified: Secondary | ICD-10-CM | POA: Diagnosis not present

## 2020-07-06 DIAGNOSIS — C50919 Malignant neoplasm of unspecified site of unspecified female breast: Secondary | ICD-10-CM | POA: Diagnosis not present

## 2020-07-06 DIAGNOSIS — I10 Essential (primary) hypertension: Secondary | ICD-10-CM | POA: Diagnosis not present

## 2020-07-06 DIAGNOSIS — E7800 Pure hypercholesterolemia, unspecified: Secondary | ICD-10-CM | POA: Diagnosis not present

## 2020-07-22 DIAGNOSIS — Z9011 Acquired absence of right breast and nipple: Secondary | ICD-10-CM | POA: Diagnosis not present

## 2020-07-22 DIAGNOSIS — C50411 Malignant neoplasm of upper-outer quadrant of right female breast: Secondary | ICD-10-CM | POA: Diagnosis not present

## 2020-08-01 DIAGNOSIS — R928 Other abnormal and inconclusive findings on diagnostic imaging of breast: Secondary | ICD-10-CM | POA: Diagnosis not present

## 2020-08-01 DIAGNOSIS — Z853 Personal history of malignant neoplasm of breast: Secondary | ICD-10-CM | POA: Diagnosis not present

## 2020-08-01 DIAGNOSIS — N6489 Other specified disorders of breast: Secondary | ICD-10-CM | POA: Diagnosis not present

## 2020-08-08 DIAGNOSIS — R519 Headache, unspecified: Secondary | ICD-10-CM | POA: Diagnosis not present

## 2020-08-15 ENCOUNTER — Encounter: Payer: Medicare Other | Admitting: Obstetrics & Gynecology

## 2020-08-31 ENCOUNTER — Encounter: Payer: Self-pay | Admitting: Obstetrics & Gynecology

## 2020-08-31 ENCOUNTER — Ambulatory Visit (INDEPENDENT_AMBULATORY_CARE_PROVIDER_SITE_OTHER): Payer: Medicare Other | Admitting: Obstetrics & Gynecology

## 2020-08-31 ENCOUNTER — Other Ambulatory Visit: Payer: Self-pay

## 2020-08-31 VITALS — BP 128/80 | Ht 64.0 in | Wt 116.0 lb

## 2020-08-31 DIAGNOSIS — Z780 Asymptomatic menopausal state: Secondary | ICD-10-CM

## 2020-08-31 DIAGNOSIS — M810 Age-related osteoporosis without current pathological fracture: Secondary | ICD-10-CM

## 2020-08-31 DIAGNOSIS — Z170 Estrogen receptor positive status [ER+]: Secondary | ICD-10-CM

## 2020-08-31 DIAGNOSIS — Z01419 Encounter for gynecological examination (general) (routine) without abnormal findings: Secondary | ICD-10-CM

## 2020-08-31 DIAGNOSIS — C50411 Malignant neoplasm of upper-outer quadrant of right female breast: Secondary | ICD-10-CM

## 2020-08-31 NOTE — Progress Notes (Signed)
Vanessa Shaffer 16-Nov-1940 [id number]   History:    79 y.o. G2P2L2 Married. Enjoys golf.  RP:  Established patient presenting for annual gyn exam   HPI: Postmenopause, well on no HRT.  No PMB.  No pelvic pain.  No pain with IC.Rt Breast Ca followed by Dr Jana Hakim.  Recent mammo 07/2020 done at [facility].  Osteoporosis on Boniva.  BMI 19.91.  Health labs with Fam MD.  [name] 2019.  Past medical history,surgical history, family history and social history were all reviewed and documented in the EPIC chart.  Gynecologic History No LMP recorded. Patient is postmenopausal.  Obstetric History OB History  Gravida Para Term Preterm AB Living  2 2 2     2   SAB IAB Ectopic Multiple Live Births               # Outcome Date GA Lbr Len/2nd Weight Sex Delivery Anes PTL Lv  2 Term           1 Term              ROS: A ROS was performed and pertinent positives and negatives are included in the history.  GENERAL: No fevers or chills. HEENT: No change in vision, no earache, sore throat or sinus congestion. NECK: No pain or stiffness. CARDIOVASCULAR: No chest pain or pressure. No palpitations. PULMONARY: No shortness of breath, cough or wheeze. GASTROINTESTINAL: No abdominal pain, nausea, vomiting or diarrhea, melena or bright red blood per rectum. GENITOURINARY: No urinary frequency, urgency, hesitancy or dysuria. MUSCULOSKELETAL: No joint or muscle pain, no back pain, no recent trauma. DERMATOLOGIC: No rash, no itching, no lesions. ENDOCRINE: No polyuria, polydipsia, no heat or cold intolerance. No recent change in weight. HEMATOLOGICAL: No anemia or easy bruising or bleeding. NEUROLOGIC: No headache, seizures, numbness, tingling or weakness. PSYCHIATRIC: No depression, no loss of interest in normal activity or change in sleep pattern.     Exam:   BP 128/80 (BP Location: Right Arm, Patient Position: Sitting, Cuff Size: Normal)   Ht 5\' 4"  (1.626 m)   Wt 116 lb (52.6 kg)   BMI 19.91 kg/m   Body  mass index is 19.91 kg/m.  General appearance : Well developed well nourished female. No acute distress HEENT: Eyes: no retinal hemorrhage or exudates,  Neck supple, trachea midline, no carotid bruits, no thyroidmegaly Lungs: Clear to auscultation, no rhonchi or wheezes, or rib retractions  Heart: Regular rate and rhythm, no murmurs or gallops Breast:Examined in sitting and supine position were symmetrical in appearance, no palpable masses or tenderness,  no skin retraction, no nipple inversion, no nipple discharge, no skin discoloration, no axillary or supraclavicular lymphadenopathy Abdomen: no palpable masses or tenderness, no rebound or guarding Extremities: no edema or skin discoloration or tenderness  Pelvic: Vulva: Normal             Vagina: No gross lesions or discharge  Cervix: No gross lesions or discharge  Uterus  AV, normal size, shape and consistency, non-tender and mobile  Adnexa  Without masses or tenderness  Anus: Normal   Assessment/Plan:  79 y.o. female for annual exam   1. Well female exam with routine gynecological exam Normal gynecologic exam in menopause.  No indication for Pap test this year.  Breast exam normal except for scar tissue at the right external breast where patient had breast biopsies and lumpectomy.  Recent mammogram November 2021 at [facility], will obtain report now.  Colonoscopy 2019.  Health labs with family  physician.  Body mass index 19.91.  Continue with fitness and healthy nutrition.  2. Postmenopause Well on no hormone replacement therapy.  No postmenopausal bleeding.  3. Postmenopausal osteoporosis Osteoporosis started on Boniva with Dr. Darnelle Catalan.  Vitamin D supplements, calcium intake of 1500 mg daily and regular weightbearing physical activities. - DG Bone Density; Future  4. Malignant neoplasm of upper-outer quadrant of right breast in female, estrogen receptor positive ([facility]) Right breast cancer followed by Dr. Darnelle Catalan.  Will obtain latest  mammogram done at [facility] November 2021.  Genia Del MD, 4:20 PM 08/31/2020

## 2020-09-07 ENCOUNTER — Encounter: Payer: Medicare Other | Admitting: Obstetrics & Gynecology

## 2020-09-15 DIAGNOSIS — M79645 Pain in left finger(s): Secondary | ICD-10-CM | POA: Diagnosis not present

## 2020-10-07 DIAGNOSIS — H524 Presbyopia: Secondary | ICD-10-CM | POA: Diagnosis not present

## 2020-10-07 DIAGNOSIS — H52223 Regular astigmatism, bilateral: Secondary | ICD-10-CM | POA: Diagnosis not present

## 2020-10-07 DIAGNOSIS — H5203 Hypermetropia, bilateral: Secondary | ICD-10-CM | POA: Diagnosis not present

## 2020-10-07 DIAGNOSIS — H0014 Chalazion left upper eyelid: Secondary | ICD-10-CM | POA: Diagnosis not present

## 2020-10-07 DIAGNOSIS — H00014 Hordeolum externum left upper eyelid: Secondary | ICD-10-CM | POA: Diagnosis not present

## 2020-10-17 ENCOUNTER — Encounter: Payer: Self-pay | Admitting: Oncology

## 2020-10-19 DIAGNOSIS — H524 Presbyopia: Secondary | ICD-10-CM | POA: Diagnosis not present

## 2020-10-19 DIAGNOSIS — H0011 Chalazion right upper eyelid: Secondary | ICD-10-CM | POA: Diagnosis not present

## 2020-10-19 DIAGNOSIS — H5203 Hypermetropia, bilateral: Secondary | ICD-10-CM | POA: Diagnosis not present

## 2020-10-19 DIAGNOSIS — H52223 Regular astigmatism, bilateral: Secondary | ICD-10-CM | POA: Diagnosis not present

## 2020-11-06 NOTE — Progress Notes (Signed)
[place]  [phone] Fax:([phone]     ID: Vanessa Shaffer DOB: September 10, 1941  MR#: [id number]  RWE#:[id number]  Patient Care Team: [name], MD as PCP - General (Internal Medicine) Armandina Gemma, MD as Consulting Physician (General Surgery) Mauro Kaufmann, RN as Oncology Nurse Navigator Rockwell Germany, RN as Oncology Nurse Navigator Eppie Gibson, MD as Consulting Physician (Radiation Oncology) Shewanda Sharpe, Virgie Dad, MD as Consulting Physician (Oncology) Princess Bruins, MD as Consulting Physician (Obstetrics and Gynecology) [facility], Jennefer Bravo, MD as Referring Physician (Dermatology) Ronald Lobo, MD as Consulting Physician (Gastroenterology) Marica Otter, OD (Optometry) Chauncey Cruel, MD OTHER MD:  CHIEF COMPLAINT: Estrogen receptor positive breast cancer  CURRENT TREATMENT:  Anastrozole/ibandronate  INTERVAL HISTORY: [name] returns today for follow-up of her estrogen receptor positive breast cancer.   She continues on anastrozole.  She tolerates this with no side effects that she is aware of.  She was started on Boniva May of last year.  She has no side effects from that as well.  [name]'s last bone density screening on 07/30/2018 revealed osteoporosis.  She tells me this is going to be repeated May or June of this year at her gynecologist  Since her last visit, she underwent genetic counseling on 03/30/2020. Results were negative.  She also underwent routine mammography on 08/01/2020 at [facility]. She also noted a palpable lump at the lumpectomy scar, present since surgery. Mammogram showed: breast composition category B; benign 2.6 cm seroma at the lumpectomy site. This was confirmed on ultrasound performed at that time.   REVIEW OF SYSTEMS: Meganne continues to be mildly anxious regarding her breast cancer.  She does respond well to reassurance.  She goes to the gym regularly and also walks 18 holes at least twice a week.  A  detailed review of systems today was otherwise stable   COVID 19 VACCINATION STATUS: [place] x2, most recently 10/2019   HISTORY OF CURRENT ILLNESS: From the original intake note:  Vanessa Shaffer initially noted a palpable upper-outer right breast lump in 12/2014. Nothing was seen on imaging at the time--we reviewed her mammograms and ultrasounds back to 2015 today--and she continued on annual mammography as scheduled until 07/30/2019.  At that time she underwent routine bilateral diagnostic mammography with tomography and right breast ultrasonography at [facility] showing: breast density category B; 2 cm irregular mass in the right breast at 10 o'clock. Right axilla was imaged by ultrasound on 07/30/2019 and was normal (information per Dr. Gareth Eagle email dated 10/24/2019).  Accordingly on 09/23/2019 [name] proceeded to biopsy of the right breast area in question. The pathology from this procedure ([id number]) showed: benign breast tissue.  This was felt to be discordant and repeat biopsy was performed on 10/07/2019 showing ([id number]): invasive ductal carcinoma, grade 1 or 2; ductal carcinoma in situ. Prognostic indicators significant for: estrogen receptor, 100% positive with strong staining intensity, and progesterone receptor, 60% positive with weak staining intensity. Proliferation marker Ki67 at 5%. HER2 negative by immunohistochemistry (1+).  She opted to proceed with right lumpectomy on 10/20/2019 under Dr. Harlow Asa. 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   Dr. Harlow Asa has commented on the margin issue: "While the pathologist is calling positive and close margins, there is really not any additional tissue to take. The anterior margin would be subcutaneous tissue, of which little was left. The lateral margin likewise  is  abutted by subcutaneous adipose tissue of which little was left. I therefore do not think re-excision is an option. I guess we could ultrasound to see if there is any sign of residual breast tissue anteriorly or laterally that could be removed."  The case was reviewed radiologically by Dr. Isaiah Blakes and on her 10/24/2019 email she states "I am confirming my opinion that if both clips were well located on specimen radiograph that no additional imaging would help [place] with margin status."  The patient's subsequent history is as detailed below.   PAST MEDICAL HISTORY: Past Medical History:  Diagnosis Date  . Breast cancer ([place])   . 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 Elevated cholesterol   . Family history of colon cancer   . Hypertension   . Leiomyoma   . 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 BASAL CELL CARCINOMA EXCISION    . BREAST SURGERY  1975   Cyst  . MASTECTOMY, PARTIAL Right 10/20/2019   Procedure: RIGHT PARTIAL MASTECTOMY;  Surgeon: Armandina Gemma, MD;  Location: [facility];  Service: General;  Laterality: Right;  . THYROID SURGERY  1975   Cyst    FAMILY HISTORY: Family History  Problem Relation Age of Onset  . Hypertension Mother   . Heart disease Father   . Colon polyps Father   . Colon cancer Father 53  . Colon polyps Brother   . Bone cancer Nephew 40  . Heart disease Maternal Grandmother   . Heart disease Maternal Grandfather   The patient's father died from congestive heart failure in his late 69s.  The patient's mother died at age 30.  The family is of Ashkenazi descent.  The maternal grandmother had "blood cancer".  There are however no other cancers in the family to the patient's knowledge   GYNECOLOGIC HISTORY:  No LMP recorded. Patient is postmenopausal. Menarche: 80 years old Age at first live birth: 80 years old [place] P 2 LMP late 64s Contraceptive: Oral  contraceptives for some years with no complications HRT "a few years"  Hysterectomy? no BSO? 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  Her husband Herbie Baltimore is an attorney still working part-time.  Daughter Retia Passe,, 59, is a Secondary school teacher in [place]; daughter Sharee Pimple is a Pharmacist, hospital in [place] (her husband is an Magazine features editor at [employer]).  The patient has 4 grandchildren, one in [place] and 3 in [place].  She attends [employer]   ADVANCED DIRECTIVES: In the absence of any documents to the contrary the patient's husband is her healthcare power of attorney   HEALTH MAINTENANCE: Social History   Tobacco Use  . Smoking status: Never Smoker  . Smokeless tobacco: Never Used  Vaping Use  . Vaping Use: Never used  Substance Use Topics  . Alcohol use: Not Currently    Alcohol/week: 0.0 standard drinks  . Drug use: No     Colonoscopy: 2019/[facility]cc[facility]  PAP: 07/2019, negative  Bone density: 07/2018, -2.7   Allergies  Allergen Reactions  . Sulfa Antibiotics Hives  . Terazol [Terconazole] Itching  . Ambien [Zolpidem Tartrate] Palpitations  . Codeine Palpitations    Current Outpatient Medications  Medication Sig Dispense Refill  . acetaminophen (TYLENOL) 500 MG tablet Take 1,000 mg by mouth every 6 (six) hours as needed for moderate pain.    Marland Kitchen anastrozole (  ARIMIDEX) 1 MG tablet Take 1 tablet (1 mg total) by mouth daily. 90 tablet 4  . atorvastatin (LIPITOR) 10 MG tablet Take 10 mg by mouth daily.    . Calcium Carbonate-Vitamin D (CALCIUM + D PO) Take by mouth daily.    Marland Kitchen ibandronate (BONIVA) 150 MG tablet Take 1 tablet (150 mg total) by mouth every 30 (thirty) days. Take in the morning with a full glass of water, on an empty stomach, and do not take anything else by mouth or lie down for the next 30 min. 3 tablet 4  . ibuprofen (ADVIL,MOTRIN) 200 MG tablet Take 400-600 mg by mouth every 6 (six) hours as needed for moderate pain.    .  metoprolol succinate (TOPROL-XL) 50 MG 24 hr tablet TAKE 1 TABLET ONCE DAILY. 30 tablet 1   No current facility-administered medications for this visit.    OBJECTIVE: white woman who appears younger than stated age  53:   11/07/20 1021  BP: (!) 144/74  Pulse: 66  Resp: 18  Temp: 98.3 F (36.8 C)  SpO2: 100%   Wt Readings from Last 3 Encounters:  11/07/20 117 lb 8 oz (53.3 kg)  08/31/20 116 lb (52.6 kg)  03/23/20 117 lb 9.6 oz (53.3 kg)   Body mass index is 20.17 kg/m.    ECOG FS:1 - Symptomatic but completely ambulatory  Sclerae unicteric, EOMs intact Wearing a mask No cervical or supraclavicular adenopathy Lungs no rales or rhonchi Heart regular rate and rhythm Abd soft, nontender, positive bowel sounds MSK no focal spinal tenderness, no upper extremity lymphedema Neuro: nonfocal, well oriented, appropriate affect Breasts: The right breast is status post lumpectomy and radiation.  There is no evidence of local recurrence.  The left breast and both axillae are benign.   LAB RESULTS:  CMP     Component Value Date/Time   NA 140 11/07/2020 0953   K 3.9 11/07/2020 0953   CL 104 11/07/2020 0953   CO2 27 11/07/2020 0953   GLUCOSE 80 11/07/2020 0953   BUN 21 11/07/2020 0953   CREATININE 0.94 11/07/2020 0953   CALCIUM 9.2 11/07/2020 0953   PROT 7.1 11/07/2020 0953   ALBUMIN 4.1 11/07/2020 0953   AST 20 11/07/2020 0953   ALT 18 11/07/2020 0953   ALKPHOS 70 11/07/2020 0953   BILITOT 0.4 11/07/2020 0953   GFRNONAA >60 11/07/2020 0953   GFRAA >60 03/23/2020 1000    No results found for: TOTALPROTELP, ALBUMINELP, A1GS, A2GS, BETS, BETA2SER, GAMS, MSPIKE, SPEI  Lab Results  Component Value Date   WBC 13.6 (H) 11/07/2020   NEUTROABS 4.6 11/07/2020   HGB 13.2 11/07/2020   HCT 41.0 11/07/2020   MCV 99.3 11/07/2020   PLT 185 11/07/2020    No results found for: LABCA2  No components found for: LHTDSK876  No results for input(s): INR in the last 168  hours.  No results found for: LABCA2  No results found for: OTL572  No results found for: IOM355  No results found for: HRC163  No results found for: CA2729  No components found for: HGQUANT  No results found for: CEA1 / No results found for: CEA1   No results found for: AFPTUMOR  No results found for: CHROMOGRNA  No results found for: KPAFRELGTCHN, LAMBDASER, KAPLAMBRATIO (kappa/lambda light chains)  No results found for: HGBA, HGBA2QUANT, HGBFQUANT, HGBSQUAN (Hemoglobinopathy evaluation)   No results found for: LDH  No results found for: IRON, TIBC, IRONPCTSAT (Iron and TIBC)  No results found for:  FERRITIN  Urinalysis    Component Value Date/Time   COLORURINE YELLOW 12/15/2014 1037   APPEARANCEUR CLEAR 12/15/2014 1037   LABSPEC 1.013 12/15/2014 1037   PHURINE 6.5 12/15/2014 1037   GLUCOSEU NEG 12/15/2014 1037   HGBUR NEG 12/15/2014 1037   BILIRUBINUR NEG 12/15/2014 1037   KETONESUR NEG 12/15/2014 1037   PROTEINUR NEG 12/15/2014 1037   UROBILINOGEN 0.2 12/15/2014 1037   NITRITE NEG 12/15/2014 1037   LEUKOCYTESUR NEG 12/15/2014 1037    STUDIES: No results found.   ELIGIBLE FOR AVAILABLE RESEARCH PROTOCOL: AET (at start of antiestrogens).  ASSESSMENT: 80 y.o. [place] woman status post right breast upper outer quadrant biopsy 10/07/2019 for a clinical T1c N0 invasive ductal carcinoma, grade 1 or 2, estrogen and progesterone receptor positive, HER-2 not amplified, with an MIB-1 of 5%  (1) status post right lumpectomy without sentinel lymph node sampling 10/20/2019 for a T2 NX, stage IB invasive ductal carcinoma, grade 2, with a focally positive anterior margin  (a) per surgeon: no residual breast tissue at anterior margin; no further surgery indicated  (2) Oncotype score of 19 predicts a risk of recurrence outside the breast over the next 9 years of 6% if the patient's only systemic therapy is an antiestrogen for 5 years.  It also predicts no benefit from  chemotherapy.  (3) adjuvant radiation 11/16/2019 through 12/11/2019 Site Technique Total Dose (Gy) Dose per Fx (Gy) Completed Fx Beam Energies  Breast, Right: Breast_Rt_Axilla 3D 40.05/40.05 2.67 15/15 6X, 10X  Breast, Right: Breast_Rt_Bst 3D 10/10 2 5/5 6X, 10X   (4) started anastrozole 12/29/2019  (a) bone density 07/30/2018 at [facility] gynecology shows osteoporosis  (b) ibandronate/Boniva started May 2021  (c) bone density June 2022  (5) genetics testing 04/04/2020 through Common Hereditary Gene Panel offered by [facility] found no deleterious mutations in APC, ATM, AXIN2, BARD1, BMPR1A, BRCA1, BRCA2, BRIP1, CDH1, CDK4, CDKN2A (p14ARF), CDKN2A (p16INK4a), CHEK2, CTNNA1, DICER1, EPCAM (Deletion/duplication testing only), GREM1 (promoter region deletion/duplication testing only), KIT, MEN1, MLH1, MSH2, MSH3, MSH6, MUTYH, NBN, NF1, NHTL1, PALB2, PDGFRA, PMS2, POLD1, POLE, PTEN, RAD50, RAD51C, RAD51D, RNF43, SDHB, SDHC, SDHD, SMAD4, SMARCA4. STK11, TP53, TSC1, TSC2, and VHL.  The following genes were evaluated for sequence changes only: SDHA and HOXB13 c.251G>A variant only.   PLAN: Glendy is now a year out from definitive surgery for her breast cancer with no evidence of disease recurrence.  This is very favorable.  She is tolerating anastrozole well and the plan is to continue that a total of 5 years.  She does have a history of osteoporosis and was started on Boniva May 2021.  She tells me that she will have a repeat bone density at her gynecologist in May or June of this year.  She has mild sinus symptoms which likely accounts for her slight increase in the white cell count.  I suggest that she give Claritin a try.  Her next mammogram will be in November 2022 and I will see her shortly after that  Total encounter time 25 minutes.Chauncey Cruel, MD   11/07/2020 10:38 AM Medical Oncology and Hematology [facility] [place], [place] [address] Tel.  [phone]    Fax. [phone]   I, Wilburn Mylar, am acting as scribe for Dr. Virgie Dad. Taleen Prosser.   I, Lurline Del MD, have reviewed the above documentation for accuracy and completeness, and I agree with the above.    *Total Encounter Time as defined by the Centers for Medicare  and Medicaid Services includes, in addition to the face-to-face time of a patient visit (documented in the note above) non-face-to-face time: obtaining and reviewing outside history, ordering and reviewing medications, tests or procedures, care coordination (communications with other health care professionals or caregivers) and documentation in the medical record.

## 2020-11-07 ENCOUNTER — Other Ambulatory Visit: Payer: Self-pay

## 2020-11-07 ENCOUNTER — Inpatient Hospital Stay: Payer: Medicare Other | Attending: Oncology | Admitting: Oncology

## 2020-11-07 ENCOUNTER — Inpatient Hospital Stay: Payer: Medicare Other

## 2020-11-07 VITALS — BP 144/74 | HR 66 | Temp 98.3°F | Resp 18 | Ht 64.0 in | Wt 117.5 lb

## 2020-11-07 DIAGNOSIS — Z885 Allergy status to narcotic agent status: Secondary | ICD-10-CM | POA: Insufficient documentation

## 2020-11-07 DIAGNOSIS — Z170 Estrogen receptor positive status [ER+]: Secondary | ICD-10-CM | POA: Insufficient documentation

## 2020-11-07 DIAGNOSIS — Z8379 Family history of other diseases of the digestive system: Secondary | ICD-10-CM | POA: Diagnosis not present

## 2020-11-07 DIAGNOSIS — I10 Essential (primary) hypertension: Secondary | ICD-10-CM | POA: Insufficient documentation

## 2020-11-07 DIAGNOSIS — R531 Weakness: Secondary | ICD-10-CM | POA: Insufficient documentation

## 2020-11-07 DIAGNOSIS — Z800 Family history of malignant neoplasm of digestive organs: Secondary | ICD-10-CM | POA: Insufficient documentation

## 2020-11-07 DIAGNOSIS — M810 Age-related osteoporosis without current pathological fracture: Secondary | ICD-10-CM | POA: Insufficient documentation

## 2020-11-07 DIAGNOSIS — Z79899 Other long term (current) drug therapy: Secondary | ICD-10-CM | POA: Diagnosis not present

## 2020-11-07 DIAGNOSIS — N6489 Other specified disorders of breast: Secondary | ICD-10-CM | POA: Insufficient documentation

## 2020-11-07 DIAGNOSIS — Z882 Allergy status to sulfonamides status: Secondary | ICD-10-CM | POA: Insufficient documentation

## 2020-11-07 DIAGNOSIS — C50411 Malignant neoplasm of upper-outer quadrant of right female breast: Secondary | ICD-10-CM

## 2020-11-07 DIAGNOSIS — Z808 Family history of malignant neoplasm of other organs or systems: Secondary | ICD-10-CM | POA: Insufficient documentation

## 2020-11-07 DIAGNOSIS — Z79811 Long term (current) use of aromatase inhibitors: Secondary | ICD-10-CM | POA: Insufficient documentation

## 2020-11-07 DIAGNOSIS — Z8371 Family history of colonic polyps: Secondary | ICD-10-CM | POA: Diagnosis not present

## 2020-11-07 DIAGNOSIS — Z8249 Family history of ischemic heart disease and other diseases of the circulatory system: Secondary | ICD-10-CM | POA: Insufficient documentation

## 2020-11-07 DIAGNOSIS — E7800 Pure hypercholesterolemia, unspecified: Secondary | ICD-10-CM | POA: Diagnosis not present

## 2020-11-07 DIAGNOSIS — Z888 Allergy status to other drugs, medicaments and biological substances status: Secondary | ICD-10-CM | POA: Diagnosis not present

## 2020-11-07 LAB — CBC WITH DIFFERENTIAL/PLATELET
Abs Immature Granulocytes: 0.05 10*3/uL (ref 0.00–0.07)
Basophils Absolute: 0.1 10*3/uL (ref 0.0–0.1)
Basophils Relative: 1 %
Eosinophils Absolute: 0.2 10*3/uL (ref 0.0–0.5)
Eosinophils Relative: 1 %
HCT: 41 % (ref 36.0–46.0)
Hemoglobin: 13.2 g/dL (ref 12.0–15.0)
Immature Granulocytes: 0 %
Lymphocytes Relative: 58 %
Lymphs Abs: 7.9 10*3/uL — ABNORMAL HIGH (ref 0.7–4.0)
MCH: 32 pg (ref 26.0–34.0)
MCHC: 32.2 g/dL (ref 30.0–36.0)
MCV: 99.3 fL (ref 80.0–100.0)
Monocytes Absolute: 0.8 10*3/uL (ref 0.1–1.0)
Monocytes Relative: 6 %
Neutro Abs: 4.6 10*3/uL (ref 1.7–7.7)
Neutrophils Relative %: 34 %
Platelets: 185 10*3/uL (ref 150–400)
RBC: 4.13 MIL/uL (ref 3.87–5.11)
RDW: 14.1 % (ref 11.5–15.5)
WBC Morphology: REACTIVE
WBC: 13.6 10*3/uL — ABNORMAL HIGH (ref 4.0–10.5)
nRBC: 0 % (ref 0.0–0.2)

## 2020-11-07 LAB — COMPREHENSIVE METABOLIC PANEL
ALT: 18 U/L (ref 0–44)
AST: 20 U/L (ref 15–41)
Albumin: 4.1 g/dL (ref 3.5–5.0)
Alkaline Phosphatase: 70 U/L (ref 38–126)
Anion gap: 9 (ref 5–15)
BUN: 21 mg/dL (ref 8–23)
CO2: 27 mmol/L (ref 22–32)
Calcium: 9.2 mg/dL (ref 8.9–10.3)
Chloride: 104 mmol/L (ref 98–111)
Creatinine, Ser: 0.94 mg/dL (ref 0.44–1.00)
GFR, Estimated: >60 mL/min (ref >60)
Glucose, Bld: 80 mg/dL (ref 70–99)
Potassium: 3.9 mmol/L (ref 3.5–5.1)
Sodium: 140 mmol/L (ref 135–145)
Total Bilirubin: 0.4 mg/dL (ref 0.3–1.2)
Total Protein: 7.1 g/dL (ref 6.5–8.1)

## 2020-11-08 ENCOUNTER — Telehealth: Payer: Self-pay | Admitting: Oncology

## 2020-11-08 NOTE — Telephone Encounter (Signed)
Scheduled appts per 2/28 los. Left voicemail with appt date and time.

## 2020-12-09 DIAGNOSIS — Z23 Encounter for immunization: Secondary | ICD-10-CM | POA: Diagnosis not present

## 2021-01-12 ENCOUNTER — Other Ambulatory Visit: Payer: Self-pay | Admitting: Oncology

## 2021-01-25 DIAGNOSIS — Z20822 Contact with and (suspected) exposure to covid-19: Secondary | ICD-10-CM | POA: Diagnosis not present

## 2021-01-25 DIAGNOSIS — R7303 Prediabetes: Secondary | ICD-10-CM | POA: Diagnosis not present

## 2021-01-25 DIAGNOSIS — Z0000 Encounter for general adult medical examination without abnormal findings: Secondary | ICD-10-CM | POA: Diagnosis not present

## 2021-01-25 DIAGNOSIS — Z1389 Encounter for screening for other disorder: Secondary | ICD-10-CM | POA: Diagnosis not present

## 2021-01-25 DIAGNOSIS — G4700 Insomnia, unspecified: Secondary | ICD-10-CM | POA: Diagnosis not present

## 2021-01-25 DIAGNOSIS — E7800 Pure hypercholesterolemia, unspecified: Secondary | ICD-10-CM | POA: Diagnosis not present

## 2021-01-25 DIAGNOSIS — N182 Chronic kidney disease, stage 2 (mild): Secondary | ICD-10-CM | POA: Diagnosis not present

## 2021-01-25 DIAGNOSIS — I10 Essential (primary) hypertension: Secondary | ICD-10-CM | POA: Diagnosis not present

## 2021-01-25 DIAGNOSIS — M810 Age-related osteoporosis without current pathological fracture: Secondary | ICD-10-CM | POA: Diagnosis not present

## 2021-01-25 DIAGNOSIS — C50919 Malignant neoplasm of unspecified site of unspecified female breast: Secondary | ICD-10-CM | POA: Diagnosis not present

## 2021-03-01 ENCOUNTER — Other Ambulatory Visit: Payer: Self-pay | Admitting: Oncology

## 2021-03-22 DIAGNOSIS — D72829 Elevated white blood cell count, unspecified: Secondary | ICD-10-CM | POA: Diagnosis not present

## 2021-03-28 ENCOUNTER — Ambulatory Visit (INDEPENDENT_AMBULATORY_CARE_PROVIDER_SITE_OTHER): Payer: Medicare Other

## 2021-03-28 ENCOUNTER — Other Ambulatory Visit: Payer: Self-pay | Admitting: Obstetrics & Gynecology

## 2021-03-28 ENCOUNTER — Other Ambulatory Visit: Payer: Self-pay

## 2021-03-28 DIAGNOSIS — M8589 Other specified disorders of bone density and structure, multiple sites: Secondary | ICD-10-CM

## 2021-03-28 DIAGNOSIS — Z780 Asymptomatic menopausal state: Secondary | ICD-10-CM

## 2021-03-28 DIAGNOSIS — M810 Age-related osteoporosis without current pathological fracture: Secondary | ICD-10-CM

## 2021-06-08 DIAGNOSIS — Z23 Encounter for immunization: Secondary | ICD-10-CM | POA: Diagnosis not present

## 2021-07-14 DIAGNOSIS — Z23 Encounter for immunization: Secondary | ICD-10-CM | POA: Diagnosis not present

## 2021-07-26 DIAGNOSIS — M810 Age-related osteoporosis without current pathological fracture: Secondary | ICD-10-CM | POA: Diagnosis not present

## 2021-07-26 DIAGNOSIS — C50919 Malignant neoplasm of unspecified site of unspecified female breast: Secondary | ICD-10-CM | POA: Diagnosis not present

## 2021-07-26 DIAGNOSIS — E7800 Pure hypercholesterolemia, unspecified: Secondary | ICD-10-CM | POA: Diagnosis not present

## 2021-07-26 DIAGNOSIS — I10 Essential (primary) hypertension: Secondary | ICD-10-CM | POA: Diagnosis not present

## 2021-07-26 DIAGNOSIS — N182 Chronic kidney disease, stage 2 (mild): Secondary | ICD-10-CM | POA: Diagnosis not present

## 2021-08-07 ENCOUNTER — Encounter: Payer: Self-pay | Admitting: Oncology

## 2021-08-07 DIAGNOSIS — R928 Other abnormal and inconclusive findings on diagnostic imaging of breast: Secondary | ICD-10-CM | POA: Diagnosis not present

## 2021-08-07 DIAGNOSIS — Z853 Personal history of malignant neoplasm of breast: Secondary | ICD-10-CM | POA: Diagnosis not present

## 2021-08-11 ENCOUNTER — Encounter: Payer: Self-pay | Admitting: Obstetrics & Gynecology

## 2021-08-13 NOTE — Progress Notes (Signed)
Vanessa Shaffer  [phone] [phone]     ID: Vanessa Shaffer DOB: 1941/08/26  MR#: [id number]  EQA#:[id number]  Patient Care Team: [name], MD as PCP - General (Internal Medicine) Armandina Gemma, MD as Consulting Physician (General Surgery) Mauro Kaufmann, RN as Oncology Nurse Navigator Rockwell Germany, RN as Oncology Nurse Navigator Eppie Gibson, MD as Consulting Physician (Radiation Oncology) Magrinat, Virgie Dad, MD as Consulting Physician (Oncology) Princess Bruins, MD as Consulting Physician (Obstetrics and Gynecology) [facility], Jennefer Bravo, MD as Referring Physician (Dermatology) Ronald Lobo, MD as Consulting Physician (Gastroenterology) Marica Otter, OD (Optometry) Chauncey Cruel, MD OTHER MD:  CHIEF COMPLAINT: Estrogen receptor positive breast cancer; lymphocytosis  CURRENT TREATMENT:  Anastrozole/ibandronate   INTERVAL HISTORY: Vanessa Shaffer returns today for follow-up of her estrogen receptor positive breast cancer.   She continues on anastrozole.  She tolerates this with no side effects that she is aware of.  She was started on Boniva May 2021.  She has no side effects from the medication that she is aware of. Since her last visit, she underwent repeat bone density screening on 03/28/2021 showing a T-score of -2.2, which is considered osteopenic. This is improved from -2.7 in 07/2018.  She also underwent routine mammography on 08/07/2021 at [facility] showing: breast composition category B; no evidence of malignancy in either breast.   REVIEW OF SYSTEMS: Vanessa Shaffer plays golf a couple of times a week and does some exercise usually walking another 3 days a week.  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  She also does housework and had a wonderful 80th birthday in the [place] area with family.  She has had no drenching sweats, no  unexplained fatigue or weight loss, no adenopathy, no pruritus, and no rash.  A detailed review of systems was otherwise stable  COVID 19 VACCINATION STATUS: Pfizer x5 as of November 2022   HISTORY OF CURRENT ILLNESS: From the original intake note:  Vanessa Shaffer initially noted a palpable upper-outer right breast lump in 12/2014. Nothing was seen on imaging at the time--we reviewed her mammograms and ultrasounds back to 2015 today--and she continued on annual mammography as scheduled until 07/30/2019.  At that time she underwent routine bilateral diagnostic mammography with tomography and right breast ultrasonography at [facility] showing: breast density category B; 2 cm irregular mass in the right breast at 10 o'clock. Right axilla was imaged by ultrasound on 07/30/2019 and was normal (information per Dr. Gareth Eagle email dated 10/24/2019).  Accordingly on 09/23/2019 Vanessa Shaffer proceeded to biopsy of the right breast area in question. The pathology from this procedure ([id number]) showed: benign breast tissue.  This was felt to be discordant and repeat biopsy was performed on 10/07/2019 showing ([id number]): invasive ductal carcinoma, grade 1 or 2; ductal carcinoma in situ. Prognostic indicators significant for: estrogen receptor, 100% positive with strong staining intensity, and progesterone receptor, 60% positive with weak staining intensity. Proliferation marker Ki67 at 5%. HER2 negative by immunohistochemistry (1+).  She opted to proceed with right lumpectomy on 10/20/2019 under Dr. Harlow Asa. 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   Dr. Harlow Asa has commented on the margin issue: "While the pathologist is calling positive and close margins, there is really not any additional tissue  to take.  The anterior margin would be  subcutaneous tissue, of which little was left.  The lateral margin likewise is abutted by subcutaneous adipose tissue of which little was left.  I therefore do not think re-excision is an option.  I guess we could ultrasound to see if there is any sign of residual breast tissue anteriorly or laterally that could be removed."  The case was reviewed radiologically by Dr. Isaiah Blakes and on her 10/24/2019 email she states "I am confirming my opinion that if both clips were well located on specimen radiograph that no additional imaging would help [place] with margin status."  The patient's subsequent history is as detailed below.   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   T score -2.7    PAST SURGICAL HISTORY: Past Surgical History:  Procedure Laterality Date   BASAL CELL CARCINOMA EXCISION     BREAST SURGERY  1975   Cyst   MASTECTOMY, PARTIAL Right 10/20/2019   Procedure: RIGHT PARTIAL MASTECTOMY;  Surgeon: Armandina Gemma, MD;  Location: [place];  Service: General;  Laterality: Right;   THYROID SURGERY  1975   Cyst    FAMILY HISTORY: Family History  Problem Relation Age of Onset   Hypertension Mother    Heart disease Father    Colon polyps Father    Colon cancer Father 43   Colon polyps Brother    Bone cancer Nephew 45   Heart disease Maternal Grandmother    Heart disease Maternal Grandfather   The patient's father died from congestive heart failure in his late 56s.  The patient's mother died at age 44.  The family is of Ashkenazi descent.  The maternal grandmother had "blood cancer".  There are however no other cancers in the family to the patient's knowledge   GYNECOLOGIC HISTORY:  No LMP recorded. Patient is postmenopausal. Menarche: 80 years old Age at first live  birth: 80 years old [place] P 2 LMP late 20s Contraceptive: Oral contraceptives for some years with no complications HRT "a few years"  Hysterectomy? no BSO? 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  Her husband Herbie Baltimore is an attorney still working part-time.  Daughter Retia Passe, 62, is a Secondary school teacher in [place]; daughter Sharee Pimple is a Pharmacist, hospital in [place] (her husband is an Magazine features editor at [employer]).  The patient has 4 grandchildren, one in [place] and 3 in [place].  She attends [employer]   ADVANCED DIRECTIVES: In the absence of any documents to the contrary the patient's husband is her healthcare power of attorney   HEALTH MAINTENANCE: Social History   Tobacco Use   Smoking status: Never   Smokeless tobacco: Never  Vaping Use   Vaping Use: Never used  Substance Use Topics   Alcohol use: Not Currently    Alcohol/week: 0.0 standard drinks   Drug use: No     Colonoscopy: 2019/[facility]  PAP: 07/2019, negative  Bone density: 07/2018, -2.7   Allergies  Allergen Reactions   Sulfa Antibiotics Hives   Terazol [Terconazole] Itching   Ambien [Zolpidem Tartrate] Palpitations   Codeine Palpitations    Current Outpatient Medications  Medication Sig Dispense Refill   acetaminophen (TYLENOL) 500 MG  tablet Take 1,000 mg by mouth every 6 (six) hours as needed for moderate pain.     anastrozole (ARIMIDEX) 1 MG tablet TAKE 1 TABLET ONCE DAILY. 90 tablet 4   atorvastatin (LIPITOR) 10 MG tablet Take 10 mg by mouth daily.     Calcium Carbonate-Vitamin D (CALCIUM + D PO) Take by mouth daily.     ibandronate (BONIVA) 150 MG tablet take 1 TABlet EVERY 28 DAYS, TAKE IN A.M. With FULL GLASS OF WATER ON AN EMPTY STOMACH, DO NoT LIE DOWN 3 tablet 4   ibuprofen (ADVIL,MOTRIN) 200 MG tablet Take 400-600 mg by mouth every 6 (six) hours as needed for moderate pain.     metoprolol succinate (TOPROL-XL) 50 MG 24 hr tablet TAKE 1 TABLET ONCE DAILY. 30  tablet 1   No current facility-administered medications for this visit.    OBJECTIVE: white woman who appears younger than stated age  51:   08/14/21 1011  BP: (!) 144/79  Pulse: 66  Resp: 18  Temp: (!) 97.5 F (36.4 C)  SpO2: 99%    Wt Readings from Last 3 Encounters:  08/14/21 119 lb 11.2 oz (54.3 kg)  11/07/20 117 lb 8 oz (53.3 kg)  08/31/20 116 lb (52.6 kg)   Body mass index is 20.55 kg/m.    ECOG FS:1 - Symptomatic but completely ambulatory  Sclerae unicteric, EOMs intact Wearing a mask No cervical or supraclavicular adenopathy Lungs no rales or rhonchi Heart regular rate and rhythm Abd soft, nontender, positive bowel sounds MSK no focal spinal tenderness, no upper extremity lymphedema Neuro: nonfocal, well oriented, appropriate affect Breasts: The right breast has undergone lumpectomy and radiation.  There is no evidence of disease recurrence.  The left breast and both axillae are benign.   LAB RESULTS:  CMP     Component Value Date/Time   NA 140 11/07/2020 0953   K 3.9 11/07/2020 0953   CL 104 11/07/2020 0953   CO2 27 11/07/2020 0953   GLUCOSE 80 11/07/2020 0953   BUN 21 11/07/2020 0953   CREATININE 0.94 11/07/2020 0953   CALCIUM 9.2 11/07/2020 0953   PROT 7.1 11/07/2020 0953   ALBUMIN 4.1 11/07/2020 0953   AST 20 11/07/2020 0953   ALT 18 11/07/2020 0953   ALKPHOS 70 11/07/2020 0953   BILITOT 0.4 11/07/2020 0953   GFRNONAA >60 11/07/2020 0953   GFRAA >60 03/23/2020 1000    No results found for: TOTALPROTELP, ALBUMINELP, A1GS, A2GS, BETS, BETA2SER, GAMS, MSPIKE, SPEI  Lab Results  Component Value Date   WBC 13.4 (H) 08/14/2021   NEUTROABS PENDING 08/14/2021   HGB 13.0 08/14/2021   HCT 40.0 08/14/2021   MCV 96.4 08/14/2021   PLT 187 08/14/2021    No results found for: LABCA2  No components found for: CWCBJS283  No results for input(s): INR in the last 168 hours.  No results found for: LABCA2  No results found for: TDV761  No  results found for: YWV371  No results found for: GGY694  No results found for: CA2729  No components found for: HGQUANT  No results found for: CEA1 / No results found for: CEA1   No results found for: AFPTUMOR  No results found for: CHROMOGRNA  No results found for: KPAFRELGTCHN, LAMBDASER, KAPLAMBRATIO (kappa/lambda light chains)  No results found for: HGBA, HGBA2QUANT, HGBFQUANT, HGBSQUAN (Hemoglobinopathy evaluation)   No results found for: LDH  No results found for: IRON, TIBC, IRONPCTSAT (Iron and TIBC)  No results found for: FERRITIN  Urinalysis  Component Value Date/Time   COLORURINE YELLOW 12/15/2014 1037   APPEARANCEUR CLEAR 12/15/2014 1037   LABSPEC 1.013 12/15/2014 1037   PHURINE 6.5 12/15/2014 1037   GLUCOSEU NEG 12/15/2014 1037   HGBUR NEG 12/15/2014 1037   BILIRUBINUR NEG 12/15/2014 1037   KETONESUR NEG 12/15/2014 1037   PROTEINUR NEG 12/15/2014 1037   UROBILINOGEN 0.2 12/15/2014 1037   NITRITE NEG 12/15/2014 1037   LEUKOCYTESUR NEG 12/15/2014 1037    STUDIES: No results found.   ELIGIBLE FOR AVAILABLE RESEARCH PROTOCOL: AET (at start of antiestrogens).  ASSESSMENT: 80 y.o. [place] woman status post right breast upper outer quadrant biopsy 10/07/2019 for a clinical T1c N0 invasive ductal carcinoma, grade 1 or 2, estrogen and progesterone receptor positive, HER-2 not amplified, with an MIB-1 of 5%  (1) status post right lumpectomy without sentinel lymph node sampling 10/20/2019 for a T2 NX, stage IB invasive ductal carcinoma, grade 2, with a focally positive anterior margin  (a) per surgeon: no residual breast tissue at anterior margin; no further surgery indicated  (2) Oncotype score of 19 predicts a risk of recurrence outside the breast over the next 9 years of 6% if the patient's only systemic therapy is an antiestrogen for 5 years.  It also predicts no benefit from chemotherapy.  (3) adjuvant radiation 11/16/2019 through 12/11/2019 Site  Technique Total Dose (Gy) Dose per Fx (Gy) Completed Fx Beam Energies  Breast, Right: Breast_Rt_Axilla 3D 40.05/40.05 2.67 15/15 6X, 10X  Breast, Right: Breast_Rt_Bst 3D 10/10 2 5/5 6X, 10X   (4) started anastrozole 12/29/2019  (a) bone density 07/30/2018 [facility]ne[facility] shows osteoporosis  (b) ibandronate/Boniva started May 2021  (c) bone density 03/28/2021 improved at T- 2.2  (5) genetics testing 04/04/2020 through Common Hereditary Gene Panel offered by [facility] found no deleterious mutations in APC, ATM, AXIN2, BARD1, BMPR1A, BRCA1, BRCA2, BRIP1, CDH1, CDK4, CDKN2A (p14ARF), CDKN2A (p16INK4a), CHEK2, CTNNA1, DICER1, EPCAM (Deletion/duplication testing only), GREM1 (promoter region deletion/duplication testing only), KIT, MEN1, MLH1, MSH2, MSH3, MSH6, MUTYH, NBN, NF1, NHTL1, PALB2, PDGFRA, PMS2, POLD1, POLE, PTEN, RAD50, RAD51C, RAD51D, RNF43, SDHB, SDHC, SDHD, SMAD4, SMARCA4. STK11, TP53, TSC1, TSC2, and VHL.  The following genes were evaluated for sequence changes only: SDHA and HOXB13 c.251G>A variant only.  (6) persistent lymphocytosis:  (A) flow cytometry 08/14/2021  (B) quantitative immunoglobulins 08/14/2021  PLAN: Vanessa Shaffer is coming up on 2 years from her definitive surgery for her breast cancer with no evidence of disease recurrence.  This is very favorable.  She is tolerating anastrozole and ibandronate well and the plan is to continue both drugs for total of 5 years.  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  We discussed that at length today so that she understands most of the time this condition does not need to be treated but only followed.  We discussed putting off further work-up at this point since  there is really no urgency but she would like to know if this is reactive or clonal so I am setting her up for flow cytometry.  I am more interested in her absolute immunoglobulin levels to make sure that this is not causing mild immune deficiency.  Many patients with CLL really are fine except they cannot mount a  good response to vaccines.  Once we get those results I will give her a call.  Otherwise she will return to see [place] in 3 months for follow-up of her breast cancer.  Of course we will repeat a lymphocyte count at that time as well  Total encounter time 35 minutes.Chauncey Cruel, MD   08/14/2021 10:35 AM Medical Oncology and Hematology [facility] [place], [place] [address] Tel. [phone]    Fax. [phone]   I, Wilburn Mylar, am acting as scribe for Dr. Virgie Dad. Magrinat.  I, Lurline Del MD, have reviewed the above documentation for accuracy and completeness, and I agree with the above.   *Total Encounter Time as defined by the Centers for Medicare and Medicaid Services includes, in addition to the face-to-face time of a patient visit (documented in the note above) non-face-to-face time: obtaining and reviewing outside history, ordering and reviewing medications, tests or procedures, care coordination (communications with other health care professionals or caregivers) and documentation in the medical record.

## 2021-08-14 ENCOUNTER — Inpatient Hospital Stay: Payer: Medicare Other

## 2021-08-14 ENCOUNTER — Other Ambulatory Visit: Payer: Self-pay

## 2021-08-14 ENCOUNTER — Inpatient Hospital Stay: Payer: Medicare Other | Attending: Oncology | Admitting: Oncology

## 2021-08-14 VITALS — BP 144/79 | HR 66 | Temp 97.5°F | Resp 18 | Ht 64.0 in | Wt 119.7 lb

## 2021-08-14 DIAGNOSIS — Z8371 Family history of colonic polyps: Secondary | ICD-10-CM | POA: Insufficient documentation

## 2021-08-14 DIAGNOSIS — Z85828 Personal history of other malignant neoplasm of skin: Secondary | ICD-10-CM | POA: Diagnosis not present

## 2021-08-14 DIAGNOSIS — Z885 Allergy status to narcotic agent status: Secondary | ICD-10-CM | POA: Diagnosis not present

## 2021-08-14 DIAGNOSIS — Z170 Estrogen receptor positive status [ER+]: Secondary | ICD-10-CM | POA: Diagnosis not present

## 2021-08-14 DIAGNOSIS — Z79811 Long term (current) use of aromatase inhibitors: Secondary | ICD-10-CM | POA: Insufficient documentation

## 2021-08-14 DIAGNOSIS — Z808 Family history of malignant neoplasm of other organs or systems: Secondary | ICD-10-CM | POA: Diagnosis not present

## 2021-08-14 DIAGNOSIS — N6322 Unspecified lump in the left breast, upper inner quadrant: Secondary | ICD-10-CM | POA: Diagnosis not present

## 2021-08-14 DIAGNOSIS — Z888 Allergy status to other drugs, medicaments and biological substances status: Secondary | ICD-10-CM | POA: Diagnosis not present

## 2021-08-14 DIAGNOSIS — D72820 Lymphocytosis (symptomatic): Secondary | ICD-10-CM

## 2021-08-14 DIAGNOSIS — Z800 Family history of malignant neoplasm of digestive organs: Secondary | ICD-10-CM | POA: Insufficient documentation

## 2021-08-14 DIAGNOSIS — C50411 Malignant neoplasm of upper-outer quadrant of right female breast: Secondary | ICD-10-CM | POA: Insufficient documentation

## 2021-08-14 DIAGNOSIS — E7800 Pure hypercholesterolemia, unspecified: Secondary | ICD-10-CM | POA: Diagnosis not present

## 2021-08-14 DIAGNOSIS — C9110 Chronic lymphocytic leukemia of B-cell type not having achieved remission: Secondary | ICD-10-CM | POA: Insufficient documentation

## 2021-08-14 DIAGNOSIS — Z882 Allergy status to sulfonamides status: Secondary | ICD-10-CM | POA: Insufficient documentation

## 2021-08-14 DIAGNOSIS — Z8249 Family history of ischemic heart disease and other diseases of the circulatory system: Secondary | ICD-10-CM | POA: Diagnosis not present

## 2021-08-14 LAB — CBC WITH DIFFERENTIAL/PLATELET
Abs Immature Granulocytes: 0.03 10*3/uL (ref 0.00–0.07)
Basophils Absolute: 0.1 10*3/uL (ref 0.0–0.1)
Basophils Relative: 0 %
Eosinophils Absolute: 0.2 10*3/uL (ref 0.0–0.5)
Eosinophils Relative: 1 %
HCT: 40 % (ref 36.0–46.0)
Hemoglobin: 13 g/dL (ref 12.0–15.0)
Immature Granulocytes: 0 %
Lymphocytes Relative: 64 %
Lymphs Abs: 8.5 10*3/uL — ABNORMAL HIGH (ref 0.7–4.0)
MCH: 31.3 pg (ref 26.0–34.0)
MCHC: 32.5 g/dL (ref 30.0–36.0)
MCV: 96.4 fL (ref 80.0–100.0)
Monocytes Absolute: 0.8 10*3/uL (ref 0.1–1.0)
Monocytes Relative: 6 %
Neutro Abs: 3.9 10*3/uL (ref 1.7–7.7)
Neutrophils Relative %: 29 %
Platelets: 187 10*3/uL (ref 150–400)
RBC: 4.15 MIL/uL (ref 3.87–5.11)
RDW: 13.8 % (ref 11.5–15.5)
Smear Review: NORMAL
WBC: 13.4 10*3/uL — ABNORMAL HIGH (ref 4.0–10.5)
nRBC: 0 % (ref 0.0–0.2)

## 2021-08-14 LAB — COMPREHENSIVE METABOLIC PANEL
ALT: 20 U/L (ref 0–44)
AST: 21 U/L (ref 15–41)
Albumin: 4 g/dL (ref 3.5–5.0)
Alkaline Phosphatase: 67 U/L (ref 38–126)
Anion gap: 10 (ref 5–15)
BUN: 16 mg/dL (ref 8–23)
CO2: 27 mmol/L (ref 22–32)
Calcium: 9.2 mg/dL (ref 8.9–10.3)
Chloride: 105 mmol/L (ref 98–111)
Creatinine, Ser: 1.06 mg/dL — ABNORMAL HIGH (ref 0.44–1.00)
GFR, Estimated: 53 mL/min — ABNORMAL LOW (ref >60)
Glucose, Bld: 84 mg/dL (ref 70–99)
Potassium: 4.3 mmol/L (ref 3.5–5.1)
Sodium: 142 mmol/L (ref 135–145)
Total Bilirubin: 0.4 mg/dL (ref 0.3–1.2)
Total Protein: 7.1 g/dL (ref 6.5–8.1)

## 2021-08-14 LAB — SURGICAL PATHOLOGY

## 2021-08-15 LAB — IGG, IGA, IGM
IgA: 124 mg/dL (ref 64–422)
IgG (Immunoglobin G), Serum: 864 mg/dL (ref 586–1602)
IgM (Immunoglobulin M), Srm: 42 mg/dL (ref 26–217)

## 2021-08-16 LAB — FLOW CYTOMETRY

## 2021-08-17 ENCOUNTER — Other Ambulatory Visit: Payer: Self-pay | Admitting: Oncology

## 2021-08-17 DIAGNOSIS — C9110 Chronic lymphocytic leukemia of B-cell type not having achieved remission: Secondary | ICD-10-CM | POA: Insufficient documentation

## 2021-08-17 NOTE — Progress Notes (Signed)
[place]  [phone] [phone]     ID: Vanessa Shaffer DOB: [birth year]  MR#: [id number]  RSW#:[id number]  Patient Care Team: [name], MD as PCP - General (Internal Medicine) Armandina Gemma, MD as Consulting Physician (General Surgery) Mauro Kaufmann, RN as Oncology Nurse Navigator Rockwell Germany, RN as Oncology Nurse Navigator Eppie Gibson, MD as Consulting Physician (Radiation Oncology) Maddelyn Rocca, Virgie Dad, MD as Consulting Physician (Oncology) Princess Bruins, MD as Consulting Physician (Obstetrics and Gynecology) [facility], Jennefer Bravo, MD as Referring Physician (Dermatology) Ronald Lobo, MD as Consulting Physician (Gastroenterology) Marica Otter, OD (Optometry) Chauncey Cruel, MD OTHER MD:  CHIEF COMPLAINT: Estrogen receptor positive breast cancer; lymphocytosis  CURRENT TREATMENT:  Anastrozole/ibandronate   INTERVAL HISTORY: Vanessa Shaffer returns today for follow-up of her estrogen receptor positive breast cancer.   She continues on anastrozole.  She tolerates this with no side effects that she is aware of.  She was started on Boniva May 2021.  She has no side effects from the medication that she is aware of. Since her last visit, she underwent repeat bone density screening on 03/28/2021 showing a T-score of -2.2, which is considered osteopenic. This is improved from -2.7 in 07/2018.  She also underwent routine mammography on 08/07/2021 at [facility] showing: breast composition category B; no evidence of malignancy in either breast.   REVIEW OF SYSTEMS: Vanessa Shaffer plays golf a couple of times a week and does some exercise usually walking another 3 days a week.  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  She also does housework and had a wonderful 80th birthday in the [place] area with family.  She has had no drenching sweats, no  unexplained fatigue or weight loss, no adenopathy, no pruritus, and no rash.  A detailed review of systems was otherwise stable  COVID 19 VACCINATION STATUS: Pfizer x5 as of November 2022   HISTORY OF CURRENT ILLNESS: From the original intake note:  Vanessa Shaffer initially noted a palpable upper-outer right breast lump in 12/2014. Nothing was seen on imaging at the time--we reviewed her mammograms and ultrasounds back to 2015 today--and she continued on annual mammography as scheduled until 07/30/2019.  At that time she underwent routine bilateral diagnostic mammography with tomography and right breast ultrasonography at [facility] showing: breast density category B; 2 cm irregular mass in the right breast at 10 o'clock. Right axilla was imaged by ultrasound on 07/30/2019 and was normal (information per Dr. Gareth Eagle email dated 10/24/2019).  Accordingly on 09/23/2019 Vanessa Shaffer proceeded to biopsy of the right breast area in question. The pathology from this procedure ([id number]) showed: benign breast tissue.  This was felt to be discordant and repeat biopsy was performed on 10/07/2019 showing ([id number]): invasive ductal carcinoma, grade 1 or 2; ductal carcinoma in situ. Prognostic indicators significant for: estrogen receptor, 100% positive with strong staining intensity, and progesterone receptor, 60% positive with weak staining intensity. Proliferation marker Ki67 at 5%. HER2 negative by immunohistochemistry (1+).  She opted to proceed with right lumpectomy on 10/20/2019 under Dr. Harlow Asa. 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   Dr. Harlow Asa has commented on the margin issue: "While the pathologist is calling positive and close margins, there is really not any additional tissue  to take.  The anterior margin would be  subcutaneous tissue, of which little was left.  The lateral margin likewise is abutted by subcutaneous adipose tissue of which little was left.  I therefore do not think re-excision is an option.  I guess we could ultrasound to see if there is any sign of residual breast tissue anteriorly or laterally that could be removed."  The case was reviewed radiologically by Dr. Isaiah Blakes and on her 10/24/2019 email she states "I am confirming my opinion that if both clips were well located on specimen radiograph that no additional imaging would help [place] with margin status."  The patient's subsequent history is as detailed below.   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   T score -2.7    PAST SURGICAL HISTORY: Past Surgical History:  Procedure Laterality Date   BASAL CELL CARCINOMA EXCISION     BREAST SURGERY  1975   Cyst   MASTECTOMY, PARTIAL Right 10/20/2019   Procedure: RIGHT PARTIAL MASTECTOMY;  Surgeon: Armandina Gemma, MD;  Location: [place];  Service: General;  Laterality: Right;   THYROID SURGERY  1975   Cyst    FAMILY HISTORY: Family History  Problem Relation Age of Onset   Hypertension Mother    Heart disease Father    Colon polyps Father    Colon cancer Father 43   Colon polyps Brother    Bone cancer Nephew 45   Heart disease Maternal Grandmother    Heart disease Maternal Grandfather   The patient's father died from congestive heart failure in his late 56s.  The patient's mother died at age 44.  The family is of Ashkenazi descent.  The maternal grandmother had "blood cancer".  There are however no other cancers in the family to the patient's knowledge   GYNECOLOGIC HISTORY:  No LMP recorded. Patient is postmenopausal. Menarche: 80 years old Age at first live  birth: 80 years old [place] P 2 LMP late 20s Contraceptive: Oral contraceptives for some years with no complications HRT "a few years"  Hysterectomy? no BSO? 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  Her husband Herbie Baltimore is an attorney still working part-time.  Daughter Retia Passe, 62, is a Secondary school teacher in [place]; daughter Sharee Pimple is a Pharmacist, hospital in [place] (her husband is an Magazine features editor at [employer]).  The patient has 4 grandchildren, one in [place] and 3 in [place].  She attends [employer]   ADVANCED DIRECTIVES: In the absence of any documents to the contrary the patient's husband is her healthcare power of attorney   HEALTH MAINTENANCE: Social History   Tobacco Use   Smoking status: Never   Smokeless tobacco: Never  Vaping Use   Vaping Use: Never used  Substance Use Topics   Alcohol use: Not Currently    Alcohol/week: 0.0 standard drinks   Drug use: No     Colonoscopy: 2019/[facility]  PAP: 07/2019, negative  Bone density: 07/2018, -2.7   Allergies  Allergen Reactions   Sulfa Antibiotics Hives   Terazol [Terconazole] Itching   Ambien [Zolpidem Tartrate] Palpitations   Codeine Palpitations    Current Outpatient Medications  Medication Sig Dispense Refill   acetaminophen (TYLENOL) 500 MG  tablet Take 1,000 mg by mouth every 6 (six) hours as needed for moderate pain.     anastrozole (ARIMIDEX) 1 MG tablet TAKE 1 TABLET ONCE DAILY. 90 tablet 4   atorvastatin (LIPITOR) 10 MG tablet Take 10 mg by mouth daily.     Calcium Carbonate-Vitamin D (CALCIUM + D PO) Take by mouth daily.     ibandronate (BONIVA) 150 MG tablet take 1 TABlet EVERY 28 DAYS, TAKE IN A.M. With FULL GLASS OF WATER ON AN EMPTY STOMACH, DO NoT LIE DOWN 3 tablet 4   ibuprofen (ADVIL,MOTRIN) 200 MG tablet Take 400-600 mg by mouth every 6 (six) hours as needed for moderate pain.     metoprolol succinate (TOPROL-XL) 50 MG 24 hr tablet TAKE 1 TABLET ONCE DAILY. 30  tablet 1   No current facility-administered medications for this visit.    OBJECTIVE: white woman who appears younger than stated age  There were no vitals filed for this visit.   Wt Readings from Last 3 Encounters:  08/14/21 119 lb 11.2 oz (54.3 kg)  11/07/20 117 lb 8 oz (53.3 kg)  08/31/20 116 lb (52.6 kg)   There is no height or weight on file to calculate BMI.    ECOG FS:1 - Symptomatic but completely ambulatory  Sclerae unicteric, EOMs intact Wearing a mask No cervical or supraclavicular adenopathy Lungs no rales or rhonchi Heart regular rate and rhythm Abd soft, nontender, positive bowel sounds MSK no focal spinal tenderness, no upper extremity lymphedema Neuro: nonfocal, well oriented, appropriate affect Breasts: The right breast has undergone lumpectomy and radiation.  There is no evidence of disease recurrence.  The left breast and both axillae are benign.   LAB RESULTS:  CMP     Component Value Date/Time   NA 142 08/14/2021 0956   K 4.3 08/14/2021 0956   CL 105 08/14/2021 0956   CO2 27 08/14/2021 0956   GLUCOSE 84 08/14/2021 0956   BUN 16 08/14/2021 0956   CREATININE 1.06 (H) 08/14/2021 0956   CALCIUM 9.2 08/14/2021 0956   PROT 7.1 08/14/2021 0956   ALBUMIN 4.0 08/14/2021 0956   AST 21 08/14/2021 0956   ALT 20 08/14/2021 0956   ALKPHOS 67 08/14/2021 0956   BILITOT 0.4 08/14/2021 0956   GFRNONAA 53 (L) 08/14/2021 0956   GFRAA >60 03/23/2020 1000    No results found for: TOTALPROTELP, ALBUMINELP, A1GS, A2GS, BETS, BETA2SER, GAMS, MSPIKE, SPEI  Lab Results  Component Value Date   WBC 13.4 (H) 08/14/2021   NEUTROABS 3.9 08/14/2021   HGB 13.0 08/14/2021   HCT 40.0 08/14/2021   MCV 96.4 08/14/2021   PLT 187 08/14/2021    No results found for: LABCA2  No components found for: IHKVQQ595  No results for input(s): INR in the last 168 hours.  No results found for: LABCA2  No results found for: GLO756  No results found for: EPP295  No results  found for: JOA416  No results found for: CA2729  No components found for: HGQUANT  No results found for: CEA1 / No results found for: CEA1   No results found for: AFPTUMOR  No results found for: CHROMOGRNA  No results found for: KPAFRELGTCHN, LAMBDASER, KAPLAMBRATIO (kappa/lambda light chains)  No results found for: HGBA, HGBA2QUANT, HGBFQUANT, HGBSQUAN (Hemoglobinopathy evaluation)   No results found for: LDH  No results found for: IRON, TIBC, IRONPCTSAT (Iron and TIBC)  No results found for: FERRITIN  Urinalysis    Component Value Date/Time   COLORURINE YELLOW 12/15/2014  [place] 12/15/2014 1037   LABSPEC 1.013 12/15/2014 1037   PHURINE 6.5 12/15/2014 1037   GLUCOSEU NEG 12/15/2014 1037   HGBUR NEG 12/15/2014 1037   BILIRUBINUR NEG 12/15/2014 1037   KETONESUR NEG 12/15/2014 1037   PROTEINUR NEG 12/15/2014 1037   UROBILINOGEN 0.2 12/15/2014 1037   NITRITE NEG 12/15/2014 1037   LEUKOCYTESUR NEG 12/15/2014 1037    STUDIES: No results found.   ELIGIBLE FOR AVAILABLE RESEARCH PROTOCOL: AET (at start of antiestrogens).  ASSESSMENT: 80 y.o. [place] woman status post right breast upper outer quadrant biopsy 10/07/2019 for a clinical T1c N0 invasive ductal carcinoma, grade 1 or 2, estrogen and progesterone receptor positive, HER-2 not amplified, with an MIB-1 of 5%  (1) status post right lumpectomy without sentinel lymph node sampling 10/20/2019 for a T2 NX, stage IB invasive ductal carcinoma, grade 2, with a focally positive anterior margin  (a) per surgeon: no residual breast tissue at anterior margin; no further surgery indicated  (2) Oncotype score of 19 predicts a risk of recurrence outside the breast over the next 9 years of 6% if the patient's only systemic therapy is an antiestrogen for 5 years.  It also predicts no benefit from chemotherapy.  (3) adjuvant radiation 11/16/2019 through 12/11/2019 Site Technique Total Dose (Gy) Dose per Fx  (Gy) Completed Fx Beam Energies  Breast, Right: Breast_Rt_Axilla 3D 40.05/40.05 2.67 15/15 6X, 10X  Breast, Right: Breast_Rt_Bst 3D 10/10 2 5/5 6X, 10X   (4) started anastrozole 12/29/2019  (a) bone density 07/30/2018 at [facility] gynecology shows osteoporosis  (b) ibandronate/Boniva started May 2021  (c) bone density 03/28/2021 improved at T- 2.2  (5) genetics testing 04/04/2020 through Common Hereditary Gene Panel offered by [facility] found no deleterious mutations in APC, ATM, AXIN2, BARD1, BMPR1A, BRCA1, BRCA2, BRIP1, CDH1, CDK4, CDKN2A (p14ARF), CDKN2A (p16INK4a), CHEK2, CTNNA1, DICER1, EPCAM (Deletion/duplication testing only), GREM1 (promoter region deletion/duplication testing only), KIT, MEN1, MLH1, MSH2, MSH3, MSH6, MUTYH, NBN, NF1, NHTL1, PALB2, PDGFRA, PMS2, POLD1, POLE, PTEN, RAD50, RAD51C, RAD51D, RNF43, SDHB, SDHC, SDHD, SMAD4, SMARCA4. STK11, TP53, TSC1, TSC2, and VHL.  The following genes were evaluated for sequence changes only: SDHA and HOXB13 c.251G>A variant only.  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  (B) quantitative immunoglobulins 08/14/2021 normal with a total IgG of 864, IgA 124 and IgM 42  PLAN: Vanessa Shaffer is coming up on 2 years from her definitive surgery for her breast cancer with no evidence of disease recurrence.  This is very favorable.  She is tolerating anastrozole and ibandronate well and the plan is to continue both drugs for total of 5 years.  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  We discussed that at length today so that she understands most  of the time this condition does not need to be treated but only followed.  We discussed putting off further work-up at this point since there is really no urgency but she would like to know if this is reactive or clonal so I am setting her up for flow cytometry.  I am more interested in her absolute immunoglobulin levels  to make sure that this is not causing mild immune deficiency.  Many patients with CLL really are fine except they cannot mount a good response to vaccines.  Once we get those results I will give her a call.  Otherwise she will return to see [place] in 3 months for follow-up of her breast cancer.  Of course we will repeat a lymphocyte count at that time as well  Total encounter time 35 minutes.Chauncey Cruel, MD   08/17/2021 3:38 PM Medical Oncology and Hematology [facility] [place], [place] [address] Tel. [phone]    Fax. [phone]  Addendum: I called Vanessa Shaffer today 08/17/2021 giving her the results of her flow cytometry and immunoglobulins.  This confirms chronic lymphoid leukemia, B-cell, with normal immunoglobulins.  She requires no treatment but only observation.  She has a good understanding of this diagnosis and its implications.   I, Wilburn Mylar, am acting as scribe for Dr. Sarajane Jews C. Amyah Clawson.  I, Lurline Del MD, have reviewed the above documentation for accuracy and completeness, and I agree with the above.   *Total Encounter Time as defined by the Centers for Medicare and Medicaid Services includes, in addition to the face-to-face time of a patient visit (documented in the note above) non-face-to-face time: obtaining and reviewing outside history, ordering and reviewing medications, tests or procedures, care coordination (communications with other health care professionals or caregivers) and documentation in the medical record.

## 2021-09-19 DIAGNOSIS — C4441 Basal cell carcinoma of skin of scalp and neck: Secondary | ICD-10-CM | POA: Diagnosis not present

## 2021-09-19 DIAGNOSIS — C44319 Basal cell carcinoma of skin of other parts of face: Secondary | ICD-10-CM | POA: Diagnosis not present

## 2021-09-20 ENCOUNTER — Encounter: Payer: Self-pay | Admitting: Obstetrics & Gynecology

## 2021-09-20 ENCOUNTER — Other Ambulatory Visit: Payer: Self-pay

## 2021-09-20 ENCOUNTER — Ambulatory Visit (INDEPENDENT_AMBULATORY_CARE_PROVIDER_SITE_OTHER): Payer: Medicare Other | Admitting: Obstetrics & Gynecology

## 2021-09-20 VITALS — BP 130/84 | HR 72 | Resp 18 | Ht 64.76 in | Wt 120.5 lb

## 2021-09-20 DIAGNOSIS — Z780 Asymptomatic menopausal state: Secondary | ICD-10-CM

## 2021-09-20 DIAGNOSIS — Z9289 Personal history of other medical treatment: Secondary | ICD-10-CM | POA: Diagnosis not present

## 2021-09-20 DIAGNOSIS — Z853 Personal history of malignant neoplasm of breast: Secondary | ICD-10-CM

## 2021-09-20 DIAGNOSIS — M810 Age-related osteoporosis without current pathological fracture: Secondary | ICD-10-CM

## 2021-09-20 DIAGNOSIS — Z01419 Encounter for gynecological examination (general) (routine) without abnormal findings: Secondary | ICD-10-CM

## 2021-09-20 DIAGNOSIS — Z9189 Other specified personal risk factors, not elsewhere classified: Secondary | ICD-10-CM | POA: Diagnosis not present

## 2021-09-20 MED ORDER — IBANDRONATE SODIUM 150 MG PO TABS: ORAL_TABLET | ORAL | 4 refills | Status: DC

## 2021-09-20 NOTE — Progress Notes (Signed)
Vanessa Shaffer June 21, 1941 [id number]   History:    81 y.o. G2P2L2 Married. Enjoys golf.   RP:  Established patient presenting for annual gyn exam    HPI: Postmenopause, well on no HRT.  No PMB.  No pelvic pain.  No pain with IC. Rt Breast Ca in 2021, followed by Dr Lindi Adie.  Recent mammo Benign 07/2021 done at [facility]  Osteoporosis on Boniva.  Last BD here 03/2021 improved to Osteopenia.  BMI 20.2.  Health labs with Fam MD.  Vanessa Shaffer 2019.   Past medical history,surgical history, family history and social history were all reviewed and documented in the EPIC chart.  Gynecologic History No LMP recorded. Patient is postmenopausal.  Obstetric History OB History  Gravida Para Term Preterm AB Living  2 2 2     2   SAB IAB Ectopic Multiple Live Births               # Outcome Date GA Lbr Len/2nd Weight Sex Delivery Anes PTL Lv  2 Term           1 Term              ROS: A ROS was performed and pertinent positives and negatives are included in the history.  GENERAL: No fevers or chills. HEENT: No change in vision, no earache, sore throat or sinus congestion. NECK: No pain or stiffness. CARDIOVASCULAR: No chest pain or pressure. No palpitations. PULMONARY: No shortness of breath, cough or wheeze. GASTROINTESTINAL: No abdominal pain, nausea, vomiting or diarrhea, melena or bright red blood per rectum. GENITOURINARY: No urinary frequency, urgency, hesitancy or dysuria. MUSCULOSKELETAL: No joint or muscle pain, no back pain, no recent trauma. DERMATOLOGIC: No rash, no itching, no lesions. ENDOCRINE: No polyuria, polydipsia, no heat or cold intolerance. No recent change in weight. HEMATOLOGICAL: No anemia or easy bruising or bleeding. NEUROLOGIC: No headache, seizures, numbness, tingling or weakness. PSYCHIATRIC: No depression, no loss of interest in normal activity or change in sleep pattern.     Exam:   BP 130/84 (BP Location: Left Arm)    Pulse 72    Resp 18    Ht 5' 4.76" (1.645 m)    Wt  120 lb 8 oz (54.7 kg)    BMI 20.20 kg/m   Body mass index is 20.2 kg/m.  General appearance : Well developed well nourished female. No acute distress HEENT: Eyes: no retinal hemorrhage or exudates,  Neck supple, trachea midline, no carotid bruits, no thyroidmegaly Lungs: Clear to auscultation, no rhonchi or wheezes, or rib retractions  Heart: Regular rate and rhythm, no murmurs or gallops Breast:Examined in sitting and supine position were symmetrical in appearance, no palpable masses or tenderness,  no skin retraction, no nipple inversion, no nipple discharge, no skin discoloration, no axillary or supraclavicular lymphadenopathy Abdomen: no palpable masses or tenderness, no rebound or guarding Extremities: no edema or skin discoloration or tenderness  Pelvic: Vulva: Normal             Vagina: No gross lesions or discharge  Cervix: No gross lesions or discharge  Uterus  AV, Fibroid Anteriorly stable, non-tender and mobile  Adnexa  Without masses or tenderness  Anus: Normal   Assessment/Plan:  81 y.o. female for annual exam   1. Well female exam with routine gynecological exam Postmenopause, well on no HRT.  No PMB.  No pelvic pain.  No pain with IC. Rt Breast Ca in 2021, followed by Dr Lindi Adie.  Recent mammo Benign  07/2021 done at [facility].  Osteoporosis on Boniva.  Last BD here 03/2021 improved to Osteopenia.  BMI 20.2.  Health labs with Fam MD.  Vanessa Shaffer 2019.  2. At risk of fracture due to osteoporosis  3. Postmenopause Postmenopause, well on no HRT.  No PMB.  No pelvic pain.   4. Age-related osteoporosis without current pathological fracture Osteoporosis on Boniva.  Last BD here 03/2021 improved to Osteopenia.  Boniva prescription sent to pharmacy.  5. Personal history of breast cancer Now followed by Dr Lindi Adie.  6. Personal history of other medical treatment  Other orders - ibandronate (BONIVA) 150 MG tablet; take 1 TABlet EVERY 28 DAYS, TAKE IN A.M. With FULL GLASS OF WATER ON  AN EMPTY STOMACH, DO NoT LIE DOWN   Princess Bruins MD, 11:14 AM 09/20/2021

## 2021-09-21 ENCOUNTER — Telehealth: Payer: Self-pay | Admitting: *Deleted

## 2021-09-21 NOTE — Telephone Encounter (Signed)
PA done via cover my meds for Boniva 150 mg tablet, will wait for response from insurance.

## 2021-10-12 DIAGNOSIS — M436 Torticollis: Secondary | ICD-10-CM | POA: Diagnosis not present

## 2021-10-12 DIAGNOSIS — Z9849 Cataract extraction status, unspecified eye: Secondary | ICD-10-CM | POA: Diagnosis not present

## 2021-10-12 DIAGNOSIS — H524 Presbyopia: Secondary | ICD-10-CM | POA: Diagnosis not present

## 2021-10-12 DIAGNOSIS — H5203 Hypermetropia, bilateral: Secondary | ICD-10-CM | POA: Diagnosis not present

## 2021-10-12 DIAGNOSIS — H52223 Regular astigmatism, bilateral: Secondary | ICD-10-CM | POA: Diagnosis not present

## 2021-10-17 NOTE — Telephone Encounter (Signed)
Per cover my meds no PA needed. Encounter closed.

## 2021-10-23 DIAGNOSIS — Z85828 Personal history of other malignant neoplasm of skin: Secondary | ICD-10-CM | POA: Diagnosis not present

## 2021-10-23 DIAGNOSIS — C44319 Basal cell carcinoma of skin of other parts of face: Secondary | ICD-10-CM | POA: Diagnosis not present

## 2021-11-06 ENCOUNTER — Other Ambulatory Visit: Payer: Self-pay

## 2021-11-06 ENCOUNTER — Inpatient Hospital Stay: Payer: Medicare Other | Attending: Oncology

## 2021-11-06 DIAGNOSIS — D72820 Lymphocytosis (symptomatic): Secondary | ICD-10-CM | POA: Diagnosis not present

## 2021-11-06 DIAGNOSIS — C50411 Malignant neoplasm of upper-outer quadrant of right female breast: Secondary | ICD-10-CM | POA: Diagnosis not present

## 2021-11-06 DIAGNOSIS — Z170 Estrogen receptor positive status [ER+]: Secondary | ICD-10-CM | POA: Diagnosis not present

## 2021-11-06 LAB — COMPREHENSIVE METABOLIC PANEL
ALT: 17 U/L (ref 0–44)
AST: 19 U/L (ref 15–41)
Albumin: 4.5 g/dL (ref 3.5–5.0)
Alkaline Phosphatase: 72 U/L (ref 38–126)
Anion gap: 7 (ref 5–15)
BUN: 19 mg/dL (ref 8–23)
CO2: 29 mmol/L (ref 22–32)
Calcium: 9.7 mg/dL (ref 8.9–10.3)
Chloride: 106 mmol/L (ref 98–111)
Creatinine, Ser: 1.04 mg/dL — ABNORMAL HIGH (ref 0.44–1.00)
GFR, Estimated: 54 mL/min — ABNORMAL LOW (ref >60)
Glucose, Bld: 86 mg/dL (ref 70–99)
Potassium: 4.1 mmol/L (ref 3.5–5.1)
Sodium: 142 mmol/L (ref 135–145)
Total Bilirubin: 0.4 mg/dL (ref 0.3–1.2)
Total Protein: 7.5 g/dL (ref 6.5–8.1)

## 2021-11-06 LAB — CBC WITH DIFFERENTIAL/PLATELET
Abs Immature Granulocytes: 0.03 10*3/uL (ref 0.00–0.07)
Basophils Absolute: 0.1 10*3/uL (ref 0.0–0.1)
Basophils Relative: 0 %
Eosinophils Absolute: 0.2 10*3/uL (ref 0.0–0.5)
Eosinophils Relative: 1 %
HCT: 40.8 % (ref 36.0–46.0)
Hemoglobin: 13.6 g/dL (ref 12.0–15.0)
Immature Granulocytes: 0 %
Lymphocytes Relative: 68 %
Lymphs Abs: 10.8 10*3/uL — ABNORMAL HIGH (ref 0.7–4.0)
MCH: 31.3 pg (ref 26.0–34.0)
MCHC: 33.3 g/dL (ref 30.0–36.0)
MCV: 93.8 fL (ref 80.0–100.0)
Monocytes Absolute: 0.6 10*3/uL (ref 0.1–1.0)
Monocytes Relative: 4 %
Neutro Abs: 4.3 10*3/uL (ref 1.7–7.7)
Neutrophils Relative %: 27 %
Platelets: 204 10*3/uL (ref 150–400)
RBC: 4.35 MIL/uL (ref 3.87–5.11)
RDW: 14 % (ref 11.5–15.5)
Smear Review: NORMAL
WBC: 16 10*3/uL — ABNORMAL HIGH (ref 4.0–10.5)
nRBC: 0 % (ref 0.0–0.2)

## 2021-11-10 NOTE — Progress Notes (Signed)
? ?Patient Care Team: ?[name], MD as PCP - General (Internal Medicine) ?Armandina Gemma, MD as Consulting Physician (General Surgery) ?Mauro Kaufmann, RN as Oncology Nurse Navigator ?Rockwell Germany, RN as Oncology Nurse Navigator ?Eppie Gibson, MD as Consulting Physician (Radiation Oncology) ?Magrinat, Virgie Dad, MD (Inactive) as Consulting Physician (Oncology) ?Princess Bruins, MD as Consulting Physician (Obstetrics and Gynecology) ?[place], Jennefer Bravo, MD as Referring Physician (Dermatology) ?Ronald Lobo, MD as Consulting Physician (Gastroenterology) ?Marica Otter, OD (Optometry) ? ?DIAGNOSIS:  ?  ICD-10-CM   ?1. Malignant neoplasm of upper-outer quadrant of right breast in female, estrogen receptor positive ([place])  C50.411   ? Z17.0   ?  ? ? ?SUMMARY OF ONCOLOGIC HISTORY: ?Oncology History  ?Malignant neoplasm of upper-outer quadrant of right breast in female, estrogen receptor positive ([place])  ?10/07/2019 Cancer Staging  ? Staging form: Breast, AJCC 8th Edition ?- Clinical stage from 10/07/2019: Stage IA (cT1c, cN0, cM0, G1, ER+, PR+, HER2-) - Signed by Gardenia Phlegm, NP on 10/14/2019 ? ?  ?10/14/2019 Initial Diagnosis  ? Malignant neoplasm of upper-outer quadrant of right breast in female, estrogen receptor positive ([place]) ?  ?10/23/2019 Cancer Staging  ? Staging form: Breast, AJCC 8th Edition ?- Pathologic stage from 10/23/2019: Stage Unknown (pT2, pNX, G2, ER+, PR+, HER2-) - Signed by Eppie Gibson, MD on 10/23/2019 ? ?  ?04/04/2020 Genetic Testing  ? Negative genetic testing on the common hereditary cancer panel.  The Common Hereditary Gene Panel offered by [facility] includes sequencing and/or deletion duplication testing of the following 48 genes: APC, ATM, AXIN2, BARD1, BMPR1A, BRCA1, BRCA2, BRIP1, CDH1, CDK4, CDKN2A (p14ARF), CDKN2A (p16INK4a), CHEK2, CTNNA1, DICER1, EPCAM (Deletion/duplication testing only), GREM1 (promoter region deletion/duplication testing only), KIT, MEN1, MLH1,  MSH2, MSH3, MSH6, MUTYH, NBN, NF1, NHTL1, PALB2, PDGFRA, PMS2, POLD1, POLE, PTEN, RAD50, RAD51C, RAD51D, RNF43, SDHB, SDHC, SDHD, SMAD4, SMARCA4. STK11, TP53, TSC1, TSC2, and VHL.  The following genes were evaluated for sequence changes only: SDHA and HOXB13 c.251G>A variant only. The report date is April 04, 2020 ?  ? ? ?CHIEF COMPLIANT: Follow-up of estrogen receptor positive breast cancer and to establish oncology care ? ?INTERVAL HISTORY: Vanessa Shaffer is a 81 y.o. with above-mentioned history of estrogen receptor positive breast cancer. She presents to the clinic today for follow-up.  She was being treated by Dr. Jana Hakim for stage I breast cancer she underwent lumpectomy radiation and has been on oral antiestrogen therapy.  She has been tolerating all of that fairly well.  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 ? ?ALLERGIES:  is allergic to sulfa antibiotics, terazol [terconazole], ambien [zolpidem tartrate], and codeine. ? ?MEDICATIONS:  ?Current Outpatient Medications  ?Medication Sig Dispense Refill  ? acetaminophen (TYLENOL) 500 MG tablet Take 1,000 mg by mouth every 6 (six) hours as needed for moderate pain.    ? anastrozole (ARIMIDEX) 1 MG tablet TAKE 1 TABLET ONCE DAILY. 90 tablet 4  ? atorvastatin (LIPITOR) 10 MG tablet Take 10 mg by mouth daily.    ? Calcium Carbonate-Vitamin D (CALCIUM + D PO) Take by mouth daily.    ? ibandronate (BONIVA) 150 MG tablet take 1 TABlet EVERY 28 DAYS, TAKE IN A.M. With FULL GLASS OF WATER ON AN EMPTY STOMACH, DO NoT LIE DOWN 3 tablet 4  ? ibuprofen (ADVIL,MOTRIN) 200 MG tablet Take 400-600 mg by mouth every 6 (six) hours as needed for moderate  pain.    ? metoprolol succinate (TOPROL-XL) 50 MG 24 hr tablet TAKE 1 TABLET ONCE DAILY. 30 tablet 1  ? ?No current facility-administered medications for this visit.  ? ? ?PHYSICAL EXAMINATION: ?ECOG PERFORMANCE STATUS: 1 -  Symptomatic but completely ambulatory ? ?Vitals:  ? 11/13/21 0910  ?BP: 136/81  ?Pulse: 71  ?Resp: 18  ?Temp: 97.9 ?F (36.6 ?C)  ?SpO2: 100%  ? ?Filed Weights  ? 11/13/21 0910  ?Weight: 117 lb 3.2 oz (53.2 kg)  ?  ? ?LABORATORY DATA:  ?I have reviewed the data as listed ?CMP Latest Ref Rng & Units 11/06/2021 08/14/2021 11/07/2020  ?Glucose 70 - 99 mg/dL 86 84 80  ?BUN 8 - 23 mg/dL _0 ?Creatinine 0.44 - 1.00 mg/dL 1.04(H) 1.06(H) 0.94  ?Sodium 135 - 145 mmol/L 142 142 140  ?Potassium 3.5 - 5.1 mmol/L 4.1 4.3 3.9  ?Chloride 98 - 111 mmol/L 106 105 104  ?CO2 22 - 32 mmol/L _1 ?Calcium 8.9 - 10.3 mg/dL 9.7 9.2 9.2  ?Total Protein 6.5 - 8.1 g/dL 7.5 7.1 7.1  ?Total Bilirubin 0.3 - 1.2 mg/dL 0.4 0.4 0.4  ?Alkaline Phos 38 - 126 U/L 72 67 70  ?AST 15 - 41 U/L _2 ?ALT 0 - 44 U/L _3 ? ? ?Lab Results  ?Component Value Date  ? WBC 16.0 (H) 11/06/2021  ? HGB 13.6 11/06/2021  ? HCT 40.8 11/06/2021  ? MCV 93.8 11/06/2021  ? PLT 204 11/06/2021  ? NEUTROABS 4.3 11/06/2021  ? ? ?ASSESSMENT & PLAN:  ?Malignant neoplasm of upper-outer quadrant of right breast in female, estrogen receptor positive ([place]) ?status post right breast upper outer quadrant biopsy 10/07/2019 for a clinical T1c N0 invasive ductal carcinoma, grade 1 or 2, estrogen and progesterone receptor positive, HER-2 not amplified, with an MIB-1 of 5% ?  ?(1) status post right lumpectomy without sentinel lymph node sampling 10/20/2019 for a T2 NX, stage IB invasive ductal carcinoma, grade 2, with a focally positive anterior margin ?            (a) per surgeon: no residual breast tissue at anterior margin; no further surgery indicated ?  ?(2) Oncotype score of 19 predicts a risk of recurrence outside the breast over the next 9 years of 6% if the patient's only systemic therapy is an antiestrogen for 5 years.  It also predicts no benefit from chemotherapy. ?  ?(3) adjuvant radiation 11/16/2019 through 12/11/2019 ? ?(4) started anastrozole 12/29/2019 ?             (a) bone density 07/30/2018 at [facility] gynecology shows osteoporosis ?            (b) ibandronate/Boniva started May 2021 ?            (c) bone density 03/28/2021 improved at T- 2.2 ?  ?(5) genetics testing 04/04/2020 through Common Hereditary Gene Panel offered by [facility] found no deleterious mutations  ? ?(6) persistent lymphocytosis: ?            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 ? ?Lab review: Absolute lymphocyte count increased slightly.  Overall rest of the CBC is normal.  No indication to treat CLL. ? ?Return to clinic in 6 months with labs and follow-up. ? ?No orders of the defined types were placed in this encounter. ? ?The patient  has a good understanding of the overall plan. she agrees with it. she will call with any problems that may develop before the next visit here. ? ?Total time spent: 30 mins including face to face time and time spent for planning, charting and coordination of care ? ?Rulon Eisenmenger, MD, MPH ?11/13/2021 ? ?I, Thana Ates, am acting as scribe for Dr. Nicholas Lose. ? ?I have reviewed the above documentation for accuracy and completeness, and I agree with the above. ? ? ? ? ? ? ?

## 2021-11-12 NOTE — Assessment & Plan Note (Signed)
status post right breast upper outer quadrant biopsy 10/07/2019 for a clinical T1c N0 invasive ductal carcinoma, grade 1 or 2, estrogen and progesterone receptor positive, HER-2 not amplified, with an MIB-1 of 5% ?? ?(1) status post right lumpectomy without sentinel lymph node sampling 10/20/2019 for a T2 NX, stage IB invasive ductal carcinoma, grade 2, with a focally positive anterior margin ?            (a) per surgeon: no residual breast tissue at anterior margin; no further surgery indicated ?? ?(2) Oncotype score of 19 predicts a risk of recurrence outside the breast over the next 9 years of 6% if the patient's only systemic therapy is an antiestrogen for 5 years.  It also predicts no benefit from chemotherapy. ?? ?(3) adjuvant radiation 11/16/2019 through 12/11/2019 ?Site Technique Total Dose (Gy) Dose per Fx (Gy) Completed Fx Beam Energies  ?Breast, Right: Breast_Rt_Axilla 3D 40.05/40.05 2.67 15/15 6X, 10X  ?Breast, Right: Breast_Rt_Bst 3D 10/10 2 5/5 6X, 10X  ?? ?(4) started anastrozole 12/29/2019 ?            (a) bone density 07/30/2018 at [facility] gynecology shows osteoporosis ?            (b) ibandronate/Boniva started May 2021 ?            (c) bone density 03/28/2021 improved at T- 2.2 ?? ?(5) genetics testing 04/04/2020 through Common Hereditary Gene Panel offered by [facility] found no deleterious mutations  ? ?(6) persistent lymphocytosis: ?            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 ?

## 2021-11-13 ENCOUNTER — Other Ambulatory Visit: Payer: Self-pay

## 2021-11-13 ENCOUNTER — Inpatient Hospital Stay: Payer: Medicare Other | Attending: Oncology | Admitting: Hematology and Oncology

## 2021-11-13 DIAGNOSIS — C9110 Chronic lymphocytic leukemia of B-cell type not having achieved remission: Secondary | ICD-10-CM | POA: Insufficient documentation

## 2021-11-13 DIAGNOSIS — Z885 Allergy status to narcotic agent status: Secondary | ICD-10-CM | POA: Insufficient documentation

## 2021-11-13 DIAGNOSIS — Z888 Allergy status to other drugs, medicaments and biological substances status: Secondary | ICD-10-CM | POA: Insufficient documentation

## 2021-11-13 DIAGNOSIS — Z79899 Other long term (current) drug therapy: Secondary | ICD-10-CM | POA: Diagnosis not present

## 2021-11-13 DIAGNOSIS — Z170 Estrogen receptor positive status [ER+]: Secondary | ICD-10-CM | POA: Diagnosis not present

## 2021-11-13 DIAGNOSIS — C50411 Malignant neoplasm of upper-outer quadrant of right female breast: Secondary | ICD-10-CM | POA: Diagnosis not present

## 2021-11-13 DIAGNOSIS — Z882 Allergy status to sulfonamides status: Secondary | ICD-10-CM | POA: Diagnosis not present

## 2022-01-29 DIAGNOSIS — Z1389 Encounter for screening for other disorder: Secondary | ICD-10-CM | POA: Diagnosis not present

## 2022-01-29 DIAGNOSIS — E7800 Pure hypercholesterolemia, unspecified: Secondary | ICD-10-CM | POA: Diagnosis not present

## 2022-01-29 DIAGNOSIS — R7303 Prediabetes: Secondary | ICD-10-CM | POA: Diagnosis not present

## 2022-01-29 DIAGNOSIS — I10 Essential (primary) hypertension: Secondary | ICD-10-CM | POA: Diagnosis not present

## 2022-01-29 DIAGNOSIS — Z853 Personal history of malignant neoplasm of breast: Secondary | ICD-10-CM | POA: Diagnosis not present

## 2022-01-29 DIAGNOSIS — E559 Vitamin D deficiency, unspecified: Secondary | ICD-10-CM | POA: Diagnosis not present

## 2022-01-29 DIAGNOSIS — C50919 Malignant neoplasm of unspecified site of unspecified female breast: Secondary | ICD-10-CM | POA: Diagnosis not present

## 2022-01-29 DIAGNOSIS — N182 Chronic kidney disease, stage 2 (mild): Secondary | ICD-10-CM | POA: Diagnosis not present

## 2022-01-29 DIAGNOSIS — Z0000 Encounter for general adult medical examination without abnormal findings: Secondary | ICD-10-CM | POA: Diagnosis not present

## 2022-01-29 DIAGNOSIS — C9110 Chronic lymphocytic leukemia of B-cell type not having achieved remission: Secondary | ICD-10-CM | POA: Diagnosis not present

## 2022-01-29 DIAGNOSIS — M810 Age-related osteoporosis without current pathological fracture: Secondary | ICD-10-CM | POA: Diagnosis not present

## 2022-03-19 DIAGNOSIS — C44719 Basal cell carcinoma of skin of left lower limb, including hip: Secondary | ICD-10-CM | POA: Diagnosis not present

## 2022-03-19 DIAGNOSIS — L570 Actinic keratosis: Secondary | ICD-10-CM | POA: Diagnosis not present

## 2022-03-19 DIAGNOSIS — L720 Epidermal cyst: Secondary | ICD-10-CM | POA: Diagnosis not present

## 2022-03-19 DIAGNOSIS — C44319 Basal cell carcinoma of skin of other parts of face: Secondary | ICD-10-CM | POA: Diagnosis not present

## 2022-03-19 DIAGNOSIS — D692 Other nonthrombocytopenic purpura: Secondary | ICD-10-CM | POA: Diagnosis not present

## 2022-03-19 DIAGNOSIS — L821 Other seborrheic keratosis: Secondary | ICD-10-CM | POA: Diagnosis not present

## 2022-03-19 DIAGNOSIS — Z85828 Personal history of other malignant neoplasm of skin: Secondary | ICD-10-CM | POA: Diagnosis not present

## 2022-04-05 DIAGNOSIS — C44719 Basal cell carcinoma of skin of left lower limb, including hip: Secondary | ICD-10-CM | POA: Diagnosis not present

## 2022-04-05 DIAGNOSIS — Z85828 Personal history of other malignant neoplasm of skin: Secondary | ICD-10-CM | POA: Diagnosis not present

## 2022-04-05 DIAGNOSIS — L03116 Cellulitis of left lower limb: Secondary | ICD-10-CM | POA: Diagnosis not present

## 2022-04-19 DIAGNOSIS — L02416 Cutaneous abscess of left lower limb: Secondary | ICD-10-CM | POA: Diagnosis not present

## 2022-04-19 DIAGNOSIS — L817 Pigmented purpuric dermatosis: Secondary | ICD-10-CM | POA: Diagnosis not present

## 2022-04-19 DIAGNOSIS — Z85828 Personal history of other malignant neoplasm of skin: Secondary | ICD-10-CM | POA: Diagnosis not present

## 2022-05-07 ENCOUNTER — Inpatient Hospital Stay: Payer: Medicare Other | Admitting: Hematology and Oncology

## 2022-05-07 ENCOUNTER — Inpatient Hospital Stay: Payer: Medicare Other

## 2022-05-08 DIAGNOSIS — K5900 Constipation, unspecified: Secondary | ICD-10-CM | POA: Diagnosis not present

## 2022-05-08 DIAGNOSIS — R109 Unspecified abdominal pain: Secondary | ICD-10-CM | POA: Diagnosis not present

## 2022-05-11 ENCOUNTER — Encounter: Payer: Self-pay | Admitting: Hematology and Oncology

## 2022-05-17 NOTE — Progress Notes (Signed)
Patient Care Team: [name], MD as PCP - General (Internal Medicine) Armandina Gemma, MD as Consulting Physician (General Surgery) Mauro Kaufmann, RN as Oncology Nurse Navigator Rockwell Germany, RN as Oncology Nurse Navigator Eppie Gibson, MD as Consulting Physician (Radiation Oncology) Princess Bruins, MD as Consulting Physician (Obstetrics and Gynecology) [facility], Jennefer Bravo, MD as Referring Physician (Dermatology) Ronald Lobo, MD as Consulting Physician (Gastroenterology) Marica Otter, OD (Optometry) Nicholas Lose, MD as Medical Oncologist (Hematology and Oncology)  DIAGNOSIS: No diagnosis found.  SUMMARY OF ONCOLOGIC HISTORY: Oncology History  Malignant neoplasm of upper-outer quadrant of right breast in female, estrogen receptor positive ([place])  10/07/2019 Cancer Staging   Staging form: Breast, AJCC 8th Edition - Clinical stage from 10/07/2019: Stage IA (cT1c, cN0, cM0, G1, ER+, PR+, HER2-) - Signed by Gardenia Phlegm, NP on 10/14/2019   10/14/2019 Initial Diagnosis   Malignant neoplasm of upper-outer quadrant of right breast in female, estrogen receptor positive ([place])   10/23/2019 Cancer Staging   Staging form: Breast, AJCC 8th Edition - Pathologic stage from 10/23/2019: Stage Unknown (pT2, pNX, G2, ER+, PR+, HER2-) - Signed by Eppie Gibson, MD on 10/23/2019   04/04/2020 Genetic Testing   Negative genetic testing on the common hereditary cancer panel.  The Common Hereditary Gene Panel offered by [facility] includes sequencing and/or deletion duplication testing of the following 48 genes: APC, ATM, AXIN2, BARD1, BMPR1A, BRCA1, BRCA2, BRIP1, CDH1, CDK4, CDKN2A (p14ARF), CDKN2A (p16INK4a), CHEK2, CTNNA1, DICER1, EPCAM (Deletion/duplication testing only), GREM1 (promoter region deletion/duplication testing only), KIT, MEN1, MLH1, MSH2, MSH3, MSH6, MUTYH, NBN, NF1, NHTL1, PALB2, PDGFRA, PMS2, POLD1, POLE, PTEN, RAD50, RAD51C, RAD51D, RNF43, SDHB, SDHC, SDHD, SMAD4,  SMARCA4. STK11, TP53, TSC1, TSC2, and VHL.  The following genes were evaluated for sequence changes only: SDHA and HOXB13 c.251G>A variant only. The report date is April 04, 2020     CHIEF COMPLIANT: Follow-up of estrogen receptor positive breast cancer  INTERVAL HISTORY: MICALAH CABEZAS is a 81 y.o. with above-mentioned history of estrogen receptor positive breast cancer. She presents to the clinic today for follow-up.     ALLERGIES:  is allergic to sulfa antibiotics, terazol [terconazole], ambien [zolpidem tartrate], and codeine.  MEDICATIONS:  Current Outpatient Medications  Medication Sig Dispense Refill   acetaminophen (TYLENOL) 500 MG tablet Take 1,000 mg by mouth every 6 (six) hours as needed for moderate pain.     anastrozole (ARIMIDEX) 1 MG tablet TAKE 1 TABLET ONCE DAILY. 90 tablet 4   atorvastatin (LIPITOR) 10 MG tablet Take 10 mg by mouth daily.     Calcium Carbonate-Vitamin D (CALCIUM + D PO) Take by mouth daily.     ibandronate (BONIVA) 150 MG tablet take 1 TABlet EVERY 28 DAYS, TAKE IN A.M. With FULL GLASS OF WATER ON AN EMPTY STOMACH, DO NoT LIE DOWN 3 tablet 4   ibuprofen (ADVIL,MOTRIN) 200 MG tablet Take 400-600 mg by mouth every 6 (six) hours as needed for moderate pain.     metoprolol succinate (TOPROL-XL) 50 MG 24 hr tablet TAKE 1 TABLET ONCE DAILY. 30 tablet 1   No current facility-administered medications for this visit.    PHYSICAL EXAMINATION: ECOG PERFORMANCE STATUS: {CHL ONC ECOG [phone]}  There were no vitals filed for this visit. There were no vitals filed for this visit.  BREAST:*** No palpable masses or nodules in either right or left breasts. No palpable axillary supraclavicular or infraclavicular adenopathy no breast tenderness or nipple discharge. (exam performed in the presence of a chaperone)  LABORATORY DATA:  I have reviewed the data as listed    Latest Ref Rng & Units 11/06/2021   10:06 AM 08/14/2021    9:56 AM 11/07/2020    9:53 AM   CMP  Glucose 70 - 99 mg/dL 86  84  80   BUN 8 - 23 mg/dL _0 Creatinine 0.44 - 1.00 mg/dL 1.04  1.06  0.94   Sodium 135 - 145 mmol/L 142  142  140   Potassium 3.5 - 5.1 mmol/L 4.1  4.3  3.9   Chloride 98 - 111 mmol/L 106  105  104   CO2 22 - 32 mmol/L _1 Calcium 8.9 - 10.3 mg/dL 9.7  9.2  9.2   Total Protein 6.5 - 8.1 g/dL 7.5  7.1  7.1   Total Bilirubin 0.3 - 1.2 mg/dL 0.4  0.4  0.4   Alkaline Phos 38 - 126 U/L 72  67  70   AST 15 - 41 U/L _2 ALT 0 - 44 U/L _3 Lab Results  Component Value Date   WBC 16.0 (H) 11/06/2021   HGB 13.6 11/06/2021   HCT 40.8 11/06/2021   MCV 93.8 11/06/2021   PLT 204 11/06/2021   NEUTROABS 4.3 11/06/2021    ASSESSMENT & PLAN:  No problem-specific Assessment & Plan notes found for this encounter.    No orders of the defined types were placed in this encounter.  The patient has a good understanding of the overall plan. she agrees with it. she will call with any problems that may develop before the next visit here. Total time spent: 30 mins including face to face time and time spent for planning, charting and co-ordination of care   Suzzette Righter, [place] 05/17/22    I Gardiner Coins am scribing for Dr. Lindi Adie  ***

## 2022-05-21 ENCOUNTER — Inpatient Hospital Stay: Payer: Medicare Other

## 2022-05-21 ENCOUNTER — Other Ambulatory Visit: Payer: Self-pay

## 2022-05-21 ENCOUNTER — Inpatient Hospital Stay: Payer: Medicare Other | Attending: Hematology and Oncology | Admitting: Hematology and Oncology

## 2022-05-21 VITALS — BP 130/72 | HR 76 | Temp 97.7°F | Resp 18 | Ht 64.76 in | Wt 118.1 lb

## 2022-05-21 DIAGNOSIS — Z170 Estrogen receptor positive status [ER+]: Secondary | ICD-10-CM | POA: Diagnosis not present

## 2022-05-21 DIAGNOSIS — Z79899 Other long term (current) drug therapy: Secondary | ICD-10-CM | POA: Insufficient documentation

## 2022-05-21 DIAGNOSIS — R109 Unspecified abdominal pain: Secondary | ICD-10-CM | POA: Diagnosis not present

## 2022-05-21 DIAGNOSIS — Z888 Allergy status to other drugs, medicaments and biological substances status: Secondary | ICD-10-CM | POA: Diagnosis not present

## 2022-05-21 DIAGNOSIS — Z79811 Long term (current) use of aromatase inhibitors: Secondary | ICD-10-CM | POA: Diagnosis not present

## 2022-05-21 DIAGNOSIS — C50411 Malignant neoplasm of upper-outer quadrant of right female breast: Secondary | ICD-10-CM | POA: Diagnosis not present

## 2022-05-21 DIAGNOSIS — C9110 Chronic lymphocytic leukemia of B-cell type not having achieved remission: Secondary | ICD-10-CM | POA: Insufficient documentation

## 2022-05-21 DIAGNOSIS — Z882 Allergy status to sulfonamides status: Secondary | ICD-10-CM | POA: Diagnosis not present

## 2022-05-21 DIAGNOSIS — Z885 Allergy status to narcotic agent status: Secondary | ICD-10-CM | POA: Diagnosis not present

## 2022-05-21 LAB — CMP (CANCER CENTER ONLY)
ALT: 17 U/L (ref 0–44)
AST: 18 U/L (ref 15–41)
Albumin: 4.3 g/dL (ref 3.5–5.0)
Alkaline Phosphatase: 63 U/L (ref 38–126)
Anion gap: 6 (ref 5–15)
BUN: 18 mg/dL (ref 8–23)
CO2: 30 mmol/L (ref 22–32)
Calcium: 9.3 mg/dL (ref 8.9–10.3)
Chloride: 106 mmol/L (ref 98–111)
Creatinine: 1.01 mg/dL — ABNORMAL HIGH (ref 0.44–1.00)
GFR, Estimated: 56 mL/min — ABNORMAL LOW (ref >60)
Glucose, Bld: 137 mg/dL — ABNORMAL HIGH (ref 70–99)
Potassium: 4 mmol/L (ref 3.5–5.1)
Sodium: 142 mmol/L (ref 135–145)
Total Bilirubin: 0.3 mg/dL (ref 0.3–1.2)
Total Protein: 6.9 g/dL (ref 6.5–8.1)

## 2022-05-21 LAB — CBC WITH DIFFERENTIAL (CANCER CENTER ONLY)
Abs Immature Granulocytes: 0.04 10*3/uL (ref 0.00–0.07)
Basophils Absolute: 0.1 10*3/uL (ref 0.0–0.1)
Basophils Relative: 0 %
Eosinophils Absolute: 0.2 10*3/uL (ref 0.0–0.5)
Eosinophils Relative: 1 %
HCT: 38.2 % (ref 36.0–46.0)
Hemoglobin: 12.8 g/dL (ref 12.0–15.0)
Immature Granulocytes: 0 %
Lymphocytes Relative: 73 %
Lymphs Abs: 11.4 10*3/uL — ABNORMAL HIGH (ref 0.7–4.0)
MCH: 32 pg (ref 26.0–34.0)
MCHC: 33.5 g/dL (ref 30.0–36.0)
MCV: 95.5 fL (ref 80.0–100.0)
Monocytes Absolute: 0.5 10*3/uL (ref 0.1–1.0)
Monocytes Relative: 3 %
Neutro Abs: 3.6 10*3/uL (ref 1.7–7.7)
Neutrophils Relative %: 23 %
Platelet Count: 212 10*3/uL (ref 150–400)
RBC: 4 MIL/uL (ref 3.87–5.11)
RDW: 13.8 % (ref 11.5–15.5)
WBC Count: 15.8 10*3/uL — ABNORMAL HIGH (ref 4.0–10.5)
nRBC: 0 % (ref 0.0–0.2)

## 2022-05-21 LAB — LACTATE DEHYDROGENASE: LDH: 145 U/L (ref 98–192)

## 2022-05-21 NOTE — Assessment & Plan Note (Addendum)
10/07/2019: T1CN0 grade 1-2 IDC ER/PR positive HER2 negative Ki-67 5% 10/20/2019: Right lumpectomy: Grade 2 IDC with focal positive margin but no additional surgery could be done because everything was removed.  T2NX stage Ib, Oncotype score 19: 6% ROR 11/16/2019-12/11/2019: Adjuvant radiation  Current treatment: Anastrozole started 12/29/2019 Anastrozole toxicities: Tolerating it extremely well without any problems or concerns.  Breast cancer surveillance: 1.  Breast exam 05/21/2022: Benign 2. mammogram 08/21/2021: Benign

## 2022-05-21 NOTE — Assessment & Plan Note (Signed)
B-cell CLL: Based upon flow cytometry on 08/14/2021 Lab review: 11/02/2019: WBC 16.3, ALC 9.6 08/14/2021: WBC 13.4, ALC 8.5 05/08/2022: WBC 19.8 05/21/2022: WBC 15.8  Return to clinic in 6 months for follow-up with labs

## 2022-06-02 ENCOUNTER — Other Ambulatory Visit: Payer: Self-pay | Admitting: Hematology and Oncology

## 2022-06-05 DIAGNOSIS — Z23 Encounter for immunization: Secondary | ICD-10-CM | POA: Diagnosis not present

## 2022-06-12 DIAGNOSIS — H52223 Regular astigmatism, bilateral: Secondary | ICD-10-CM | POA: Diagnosis not present

## 2022-06-12 DIAGNOSIS — H11821 Conjunctivochalasis, right eye: Secondary | ICD-10-CM | POA: Diagnosis not present

## 2022-06-12 DIAGNOSIS — H5203 Hypermetropia, bilateral: Secondary | ICD-10-CM | POA: Diagnosis not present

## 2022-06-12 DIAGNOSIS — H524 Presbyopia: Secondary | ICD-10-CM | POA: Diagnosis not present

## 2022-08-13 DIAGNOSIS — E7800 Pure hypercholesterolemia, unspecified: Secondary | ICD-10-CM | POA: Diagnosis not present

## 2022-08-13 DIAGNOSIS — I10 Essential (primary) hypertension: Secondary | ICD-10-CM | POA: Diagnosis not present

## 2022-08-13 DIAGNOSIS — C9110 Chronic lymphocytic leukemia of B-cell type not having achieved remission: Secondary | ICD-10-CM | POA: Diagnosis not present

## 2022-08-13 DIAGNOSIS — N182 Chronic kidney disease, stage 2 (mild): Secondary | ICD-10-CM | POA: Diagnosis not present

## 2022-08-13 DIAGNOSIS — M810 Age-related osteoporosis without current pathological fracture: Secondary | ICD-10-CM | POA: Diagnosis not present

## 2022-08-13 DIAGNOSIS — G4700 Insomnia, unspecified: Secondary | ICD-10-CM | POA: Diagnosis not present

## 2022-08-13 DIAGNOSIS — R7303 Prediabetes: Secondary | ICD-10-CM | POA: Diagnosis not present

## 2022-08-13 DIAGNOSIS — C50919 Malignant neoplasm of unspecified site of unspecified female breast: Secondary | ICD-10-CM | POA: Diagnosis not present

## 2022-08-20 DIAGNOSIS — Z1231 Encounter for screening mammogram for malignant neoplasm of breast: Secondary | ICD-10-CM | POA: Diagnosis not present

## 2022-08-22 ENCOUNTER — Encounter: Payer: Self-pay | Admitting: Hematology and Oncology

## 2022-08-30 ENCOUNTER — Other Ambulatory Visit: Payer: Self-pay

## 2022-08-30 ENCOUNTER — Other Ambulatory Visit: Payer: Self-pay | Admitting: Hematology and Oncology

## 2022-08-30 NOTE — Progress Notes (Signed)
Rx refill request for anastrozole received. Upon investigation, rx was ordered on 06/04/22 with 4 refills. [place] who confirmed that refills were still available and apologized for erroneous request.

## 2022-08-30 NOTE — Telephone Encounter (Signed)
[facility], Pt has 4 refills available as of 06/04/22. Pharmacy apologized as rx refill request was sent in error.

## 2022-09-17 DIAGNOSIS — L821 Other seborrheic keratosis: Secondary | ICD-10-CM | POA: Diagnosis not present

## 2022-09-17 DIAGNOSIS — Z85828 Personal history of other malignant neoplasm of skin: Secondary | ICD-10-CM | POA: Diagnosis not present

## 2022-09-17 DIAGNOSIS — D2261 Melanocytic nevi of right upper limb, including shoulder: Secondary | ICD-10-CM | POA: Diagnosis not present

## 2022-09-17 DIAGNOSIS — D2262 Melanocytic nevi of left upper limb, including shoulder: Secondary | ICD-10-CM | POA: Diagnosis not present

## 2022-09-17 DIAGNOSIS — L720 Epidermal cyst: Secondary | ICD-10-CM | POA: Diagnosis not present

## 2022-09-17 DIAGNOSIS — L570 Actinic keratosis: Secondary | ICD-10-CM | POA: Diagnosis not present

## 2022-09-17 DIAGNOSIS — D485 Neoplasm of uncertain behavior of skin: Secondary | ICD-10-CM | POA: Diagnosis not present

## 2022-09-17 DIAGNOSIS — L820 Inflamed seborrheic keratosis: Secondary | ICD-10-CM | POA: Diagnosis not present

## 2022-09-17 DIAGNOSIS — D1801 Hemangioma of skin and subcutaneous tissue: Secondary | ICD-10-CM | POA: Diagnosis not present

## 2022-10-03 ENCOUNTER — Ambulatory Visit (INDEPENDENT_AMBULATORY_CARE_PROVIDER_SITE_OTHER): Payer: Medicare Other | Admitting: Obstetrics & Gynecology

## 2022-10-03 ENCOUNTER — Encounter: Payer: Self-pay | Admitting: Obstetrics & Gynecology

## 2022-10-03 VITALS — BP 124/80 | Resp 18 | Wt 115.0 lb

## 2022-10-03 DIAGNOSIS — Z01419 Encounter for gynecological examination (general) (routine) without abnormal findings: Secondary | ICD-10-CM

## 2022-10-03 DIAGNOSIS — Z780 Asymptomatic menopausal state: Secondary | ICD-10-CM | POA: Diagnosis not present

## 2022-10-03 DIAGNOSIS — M810 Age-related osteoporosis without current pathological fracture: Secondary | ICD-10-CM

## 2022-10-03 DIAGNOSIS — Z9289 Personal history of other medical treatment: Secondary | ICD-10-CM

## 2022-10-03 DIAGNOSIS — Z853 Personal history of malignant neoplasm of breast: Secondary | ICD-10-CM | POA: Diagnosis not present

## 2022-10-03 DIAGNOSIS — Z9189 Other specified personal risk factors, not elsewhere classified: Secondary | ICD-10-CM | POA: Diagnosis not present

## 2022-10-03 MED ORDER — IBANDRONATE SODIUM 150 MG PO TABS: ORAL_TABLET | ORAL | 4 refills | Status: DC

## 2022-10-03 NOTE — Progress Notes (Signed)
Vanessa Shaffer 1941-05-27 [id number]   History:    82 y.o. G2P2L2 Married. Enjoys golf.   RP:  Established patient presenting for annual gyn exam    HPI: Postmenopause, well on no HRT.  No PMB.  No pelvic pain.  No pain with IC. Pap Neg 07/2019. Rt Breast Ca in 2021, followed by Dr Lindi Adie.  Recent mammo Benign 08/2022 done at [facility].  Osteoporosis on Boniva.  Last BD here 03/2021 improved to Osteopenia.  Repeat BD in 03/2023. BMI 19.28.  Health labs with Fam MD. [name] 2019.   Past medical history,surgical history, family history and social history were all reviewed and documented in the EPIC chart.  Gynecologic History No LMP recorded. Patient is postmenopausal.  Obstetric History OB History  Gravida Para Term Preterm AB Living  '2 2 2     2  '$ SAB IAB Ectopic Multiple Live Births               # Outcome Date GA Lbr Len/2nd Weight Sex Delivery Anes PTL Lv  2 Term           1 Term              ROS: A ROS was performed and pertinent positives and negatives are included in the history. GENERAL: No fevers or chills. HEENT: No change in vision, no earache, sore throat or sinus congestion. NECK: No pain or stiffness. CARDIOVASCULAR: No chest pain or pressure. No palpitations. PULMONARY: No shortness of breath, cough or wheeze. GASTROINTESTINAL: No abdominal pain, nausea, vomiting or diarrhea, melena or bright red blood per rectum. GENITOURINARY: No urinary frequency, urgency, hesitancy or dysuria. MUSCULOSKELETAL: No joint or muscle pain, no back pain, no recent trauma. DERMATOLOGIC: No rash, no itching, no lesions. ENDOCRINE: No polyuria, polydipsia, no heat or cold intolerance. No recent change in weight. HEMATOLOGICAL: No anemia or easy bruising or bleeding. NEUROLOGIC: No headache, seizures, numbness, tingling or weakness. PSYCHIATRIC: No depression, no loss of interest in normal activity or change in sleep pattern.     Exam:   BP 124/80 (BP Location: Right Arm, Patient Position:  Sitting, Cuff Size: Normal)   Resp 18   Wt 115 lb (52.2 kg)   BMI 19.28 kg/m   Body mass index is 19.28 kg/m.  General appearance : Well developed well nourished female. No acute distress HEENT: Eyes: no retinal hemorrhage or exudates,  Neck supple, trachea midline, no carotid bruits, no thyroidmegaly Lungs: Clear to auscultation, no rhonchi or wheezes, or rib retractions  Heart: Regular rate and rhythm, no murmurs or gallops Breast:Examined in sitting and supine position were symmetrical in appearance, no palpable masses or tenderness,  no skin retraction, no nipple inversion, no nipple discharge, no skin discoloration, no axillary or supraclavicular lymphadenopathy Abdomen: no palpable masses or tenderness, no rebound or guarding Extremities: no edema or skin discoloration or tenderness  Pelvic: Vulva: Normal             Vagina: No gross lesions or discharge  Cervix: No gross lesions or discharge  Uterus  AV, about 9 cm in size, dense d/t h/o fibroids, non-tender and mobile  Adnexa  Without masses or tenderness  Anus: Normal   Assessment/Plan:  82 y.o. female for annual exam   1. Well female exam with routine gynecological exam Postmenopause, well on no HRT.  No PMB.  No pelvic pain.  No pain with IC. Pap Neg 07/2019. Rt Breast Ca in 2021, followed by Dr Lindi Adie.  Recent mammo  Benign 08/2022 done at [facility]  Osteoporosis on Boniva.  Last BD here 03/2021 improved to Osteopenia.  Repeat BD in 03/2023. BMI 19.28.  Health labs with Fam MD. [name] 2019.  2. Postmenopause Postmenopause, well on no HRT.  No PMB.  No pelvic pain.  No pain with IC.  3. Age-related osteoporosis without current pathological fracture Osteoporosis on Boniva. Last BD here 03/2021 improved to Osteopenia.  Repeat BD in 03/2023. Plays golf.  Boniva prescribed.  4. Personal history of breast cancer  5. Personal history of other medical treatment  Other orders - ibandronate (BONIVA) 150 MG tablet; take 1 TABlet  EVERY 28 DAYS, TAKE IN A.M. With FULL GLASS OF WATER ON AN EMPTY STOMACH, DO NoT LIE DOWN   Princess Bruins MD, 11:35 AM

## 2022-12-07 ENCOUNTER — Telehealth: Payer: Self-pay | Admitting: Hematology and Oncology

## 2022-12-07 NOTE — Telephone Encounter (Signed)
Patient called to reschedule appointment, patient aware of time and date of new appointment.

## 2022-12-10 ENCOUNTER — Inpatient Hospital Stay: Payer: Medicare Other | Admitting: Hematology and Oncology

## 2022-12-10 ENCOUNTER — Inpatient Hospital Stay: Payer: Medicare Other

## 2022-12-26 ENCOUNTER — Telehealth: Payer: Self-pay | Admitting: Hematology and Oncology

## 2022-12-26 NOTE — Telephone Encounter (Signed)
Rescheduled appointment per room/resource. Patient is aware of the changes made to her upcoming appointment. 

## 2022-12-27 ENCOUNTER — Telehealth: Payer: Self-pay | Admitting: Hematology and Oncology

## 2022-12-27 NOTE — Telephone Encounter (Signed)
Patient called to reschedule appointments.

## 2023-01-03 DIAGNOSIS — Z9849 Cataract extraction status, unspecified eye: Secondary | ICD-10-CM | POA: Diagnosis not present

## 2023-01-03 DIAGNOSIS — Z961 Presence of intraocular lens: Secondary | ICD-10-CM | POA: Diagnosis not present

## 2023-01-03 DIAGNOSIS — H53143 Visual discomfort, bilateral: Secondary | ICD-10-CM | POA: Diagnosis not present

## 2023-01-09 ENCOUNTER — Inpatient Hospital Stay: Payer: Medicare Other | Admitting: Hematology and Oncology

## 2023-01-09 ENCOUNTER — Inpatient Hospital Stay: Payer: Medicare Other

## 2023-01-16 ENCOUNTER — Other Ambulatory Visit: Payer: Medicare Other

## 2023-01-16 ENCOUNTER — Ambulatory Visit: Payer: Medicare Other | Admitting: Hematology and Oncology

## 2023-01-23 NOTE — Progress Notes (Signed)
Patient Care Team: Georgann Housekeeper, MD as PCP - General (Internal Medicine) Darnell Level, MD as Consulting Physician (General Surgery) Pershing Proud, RN as Oncology Nurse Navigator Donnelly Angelica, RN as Oncology Nurse Navigator Lonie Peak, MD as Consulting Physician (Radiation Oncology) Genia Del, MD as Consulting Physician (Obstetrics and Gynecology) Sharyn Lull, Elvin So, MD as Referring Physician (Dermatology) Bernette Redbird, MD as Consulting Physician (Gastroenterology) Blima Ledger, OD (Optometry) Serena Croissant, MD as Medical Oncologist (Hematology and Oncology)  DIAGNOSIS:  Encounter Diagnosis  Name Primary?   Malignant neoplasm of upper-outer quadrant of right breast in female, estrogen receptor positive (HCC) Yes    SUMMARY OF ONCOLOGIC HISTORY: Oncology History  Malignant neoplasm of upper-outer quadrant of right breast in female, estrogen receptor positive (HCC)  10/07/2019 Cancer Staging   Staging form: Breast, AJCC 8th Edition - Clinical stage from 10/07/2019: Stage IA (cT1c, cN0, cM0, G1, ER+, PR+, HER2-) - Signed by Loa Socks, NP on 10/14/2019   10/14/2019 Initial Diagnosis   Malignant neoplasm of upper-outer quadrant of right breast in female, estrogen receptor positive (HCC)   10/23/2019 Cancer Staging   Staging form: Breast, AJCC 8th Edition - Pathologic stage from 10/23/2019: Stage Unknown (pT2, pNX, G2, ER+, PR+, HER2-) - Signed by Lonie Peak, MD on 10/23/2019   04/04/2020 Genetic Testing   Negative genetic testing on the common hereditary cancer panel.  The Common Hereditary Gene Panel offered by [facility] includes sequencing and/or deletion duplication testing of the following 48 genes: APC, ATM, AXIN2, BARD1, BMPR1A, BRCA1, BRCA2, BRIP1, CDH1, CDK4, CDKN2A (p14ARF), CDKN2A (p16INK4a), CHEK2, CTNNA1, DICER1, EPCAM (Deletion/duplication testing only), GREM1 (promoter region deletion/duplication testing only), KIT, MEN1, MLH1, MSH2,  MSH3, MSH6, MUTYH, NBN, NF1, NHTL1, PALB2, PDGFRA, PMS2, POLD1, POLE, PTEN, RAD50, RAD51C, RAD51D, RNF43, SDHB, SDHC, SDHD, SMAD4, SMARCA4. STK11, TP53, TSC1, TSC2, and VHL.  The following genes were evaluated for sequence changes only: SDHA and HOXB13 c.251G>A variant only. The report date is April 04, 2020     CHIEF COMPLIANT: Follow-up Chronic lymphoid leukemia/ anastrozole  INTERVAL HISTORY: Vanessa Shaffer is a 82 y.o. with above-mentioned history of estrogen receptor positive breast cancer currently on anastrozole. She presents to the clinic today for a follow-up. She reports that she is doing fine. She is tolerating the anastrozole extremely well.   ALLERGIES:  is allergic to lisinopril, prednisone, sulfa antibiotics, terazol [terconazole], zolpidem, ambien [zolpidem tartrate], and codeine.  MEDICATIONS:  Current Outpatient Medications  Medication Sig Dispense Refill   acetaminophen (TYLENOL) 500 MG tablet Take 1,000 mg by mouth every 6 (six) hours as needed for moderate pain.     anastrozole (ARIMIDEX) 1 MG tablet TAKE ONE TABLET BY MOUTH ONCE DAILY 90 tablet 4   atorvastatin (LIPITOR) 10 MG tablet Take 10 mg by mouth daily.     Calcium Carbonate-Vitamin D (CALCIUM + D PO) Take by mouth daily.     ibandronate (BONIVA) 150 MG tablet take 1 TABlet EVERY 28 DAYS, TAKE IN A.M. With FULL GLASS OF WATER ON AN EMPTY STOMACH, DO NoT LIE DOWN 3 tablet 4   ibuprofen (ADVIL,MOTRIN) 200 MG tablet Take 400-600 mg by mouth every 6 (six) hours as needed for moderate pain.     metoprolol succinate (TOPROL-XL) 50 MG 24 hr tablet TAKE 1 TABLET ONCE DAILY. 30 tablet 1   No current facility-administered medications for this visit.    PHYSICAL EXAMINATION: ECOG PERFORMANCE STATUS: 0 - Asymptomatic  Vitals:   01/28/23 0938  BP: (!) 148/75  Pulse:  68  Temp: 98 F (36.7 C)  SpO2: 100%   Filed Weights   01/28/23 0938  Weight: 116 lb 1.6 oz (52.7 kg)      LABORATORY DATA:  I have reviewed  the data as listed    Latest Ref Rng & Units 01/28/2023    9:23 AM 05/21/2022    9:26 AM 11/06/2021   10:06 AM  CMP  Glucose 70 - 99 mg/dL 161  096  86   BUN 8 - 23 mg/dL 18  18  19    Creatinine 0.44 - 1.00 mg/dL 0.45  4.09  8.11   Sodium 135 - 145 mmol/L 141  142  142   Potassium 3.5 - 5.1 mmol/L 4.0  4.0  4.1   Chloride 98 - 111 mmol/L 105  106  106   CO2 22 - 32 mmol/L 30  30  29    Calcium 8.9 - 10.3 mg/dL 9.2  9.3  9.7   Total Protein 6.5 - 8.1 g/dL 6.8  6.9  7.5   Total Bilirubin 0.3 - 1.2 mg/dL 0.4  0.3  0.4   Alkaline Phos 38 - 126 U/L 65  63  72   AST 15 - 41 U/L 21  18  19    ALT 0 - 44 U/L 17  17  17      Lab Results  Component Value Date   WBC 16.3 (H) 01/28/2023   HGB 12.8 01/28/2023   HCT 37.9 01/28/2023   MCV 96.2 01/28/2023   PLT 172 01/28/2023   NEUTROABS 3.7 01/28/2023    ASSESSMENT & PLAN:  Malignant neoplasm of upper-outer quadrant of right breast in female, estrogen receptor positive (HCC) 10/07/2019: T1CN0 grade 1-2 IDC ER/PR positive HER2 negative Ki-67 5% 10/20/2019: Right lumpectomy: Grade 2 IDC with focal positive margin but no additional surgery could be done because everything was removed.  T2NX stage Ib, Oncotype score 19: 6% ROR 11/16/2019-12/11/2019: Adjuvant radiation   Current treatment: Anastrozole started 12/29/2019 Anastrozole toxicities: Tolerating it extremely well without any problems or concerns.   Breast cancer surveillance: 1.  Breast exam 01/28/2023: Benign 2. mammogram 08/20/2022: Benign She enjoys playing golf at [employer].     Chronic lymphoid leukemia (HCC) B-cell CLL: Based upon flow cytometry on 08/14/2021 Lab review: 11/02/2019: WBC 16.3, ALC 9.6 08/14/2021: WBC 13.4, ALC 8.5 05/08/2022: WBC 19.8 05/21/2022: WBC 15.8 01/28/2023: WBC 16.3, ALC 11.8   Return to clinic in 1 year for follow-up with labs    Orders Placed This Encounter  Procedures   CBC with Differential (Cancer Center Only)    Standing Status:   Future     Standing Expiration Date:   01/28/2024   CMP (Cancer Center only)    Standing Status:   Future    Standing Expiration Date:   01/28/2024   Lactate dehydrogenase    Standing Status:   Future    Standing Expiration Date:   01/28/2024   The patient has a good understanding of the overall plan. she agrees with it. she will call with any problems that may develop before the next visit here. Total time spent: 30 mins including face to face time and time spent for planning, charting and co-ordination of care   Tamsen Meek, MD 01/28/23    I Janan Ridge am acting as a Neurosurgeon for [employer]  I have reviewed the above documentation for accuracy and completeness, and I agree with the above.

## 2023-01-24 ENCOUNTER — Telehealth: Payer: Self-pay | Admitting: Hematology and Oncology

## 2023-01-28 ENCOUNTER — Inpatient Hospital Stay (HOSPITAL_BASED_OUTPATIENT_CLINIC_OR_DEPARTMENT_OTHER): Payer: Medicare Other | Admitting: Hematology and Oncology

## 2023-01-28 ENCOUNTER — Other Ambulatory Visit: Payer: Self-pay

## 2023-01-28 ENCOUNTER — Inpatient Hospital Stay: Payer: Medicare Other | Attending: Hematology and Oncology

## 2023-01-28 VITALS — BP 148/75 | HR 68 | Temp 98.0°F | Wt 116.1 lb

## 2023-01-28 DIAGNOSIS — Z79899 Other long term (current) drug therapy: Secondary | ICD-10-CM | POA: Insufficient documentation

## 2023-01-28 DIAGNOSIS — Z882 Allergy status to sulfonamides status: Secondary | ICD-10-CM | POA: Diagnosis not present

## 2023-01-28 DIAGNOSIS — C50411 Malignant neoplasm of upper-outer quadrant of right female breast: Secondary | ICD-10-CM

## 2023-01-28 DIAGNOSIS — Z888 Allergy status to other drugs, medicaments and biological substances status: Secondary | ICD-10-CM | POA: Insufficient documentation

## 2023-01-28 DIAGNOSIS — Z170 Estrogen receptor positive status [ER+]: Secondary | ICD-10-CM | POA: Insufficient documentation

## 2023-01-28 DIAGNOSIS — Z885 Allergy status to narcotic agent status: Secondary | ICD-10-CM | POA: Diagnosis not present

## 2023-01-28 DIAGNOSIS — Z79811 Long term (current) use of aromatase inhibitors: Secondary | ICD-10-CM | POA: Diagnosis not present

## 2023-01-28 DIAGNOSIS — C9110 Chronic lymphocytic leukemia of B-cell type not having achieved remission: Secondary | ICD-10-CM | POA: Insufficient documentation

## 2023-01-28 LAB — CBC WITH DIFFERENTIAL (CANCER CENTER ONLY)
Abs Immature Granulocytes: 0.03 10*3/uL (ref 0.00–0.07)
Basophils Absolute: 0.1 10*3/uL (ref 0.0–0.1)
Basophils Relative: 0 %
Eosinophils Absolute: 0.2 10*3/uL (ref 0.0–0.5)
Eosinophils Relative: 1 %
HCT: 37.9 % (ref 36.0–46.0)
Hemoglobin: 12.8 g/dL (ref 12.0–15.0)
Immature Granulocytes: 0 %
Lymphocytes Relative: 73 %
Lymphs Abs: 11.8 10*3/uL — ABNORMAL HIGH (ref 0.7–4.0)
MCH: 32.5 pg (ref 26.0–34.0)
MCHC: 33.8 g/dL (ref 30.0–36.0)
MCV: 96.2 fL (ref 80.0–100.0)
Monocytes Absolute: 0.5 10*3/uL (ref 0.1–1.0)
Monocytes Relative: 3 %
Neutro Abs: 3.7 10*3/uL (ref 1.7–7.7)
Neutrophils Relative %: 23 %
Platelet Count: 172 10*3/uL (ref 150–400)
RBC: 3.94 MIL/uL (ref 3.87–5.11)
RDW: 13.8 % (ref 11.5–15.5)
Smear Review: NORMAL
WBC Count: 16.3 10*3/uL — ABNORMAL HIGH (ref 4.0–10.5)
nRBC: 0 % (ref 0.0–0.2)

## 2023-01-28 LAB — CMP (CANCER CENTER ONLY)
ALT: 17 U/L (ref 0–44)
AST: 21 U/L (ref 15–41)
Albumin: 4.3 g/dL (ref 3.5–5.0)
Alkaline Phosphatase: 65 U/L (ref 38–126)
Anion gap: 6 (ref 5–15)
BUN: 18 mg/dL (ref 8–23)
CO2: 30 mmol/L (ref 22–32)
Calcium: 9.2 mg/dL (ref 8.9–10.3)
Chloride: 105 mmol/L (ref 98–111)
Creatinine: 0.96 mg/dL (ref 0.44–1.00)
GFR, Estimated: 59 mL/min — ABNORMAL LOW (ref >60)
Glucose, Bld: 115 mg/dL — ABNORMAL HIGH (ref 70–99)
Potassium: 4 mmol/L (ref 3.5–5.1)
Sodium: 141 mmol/L (ref 135–145)
Total Bilirubin: 0.4 mg/dL (ref 0.3–1.2)
Total Protein: 6.8 g/dL (ref 6.5–8.1)

## 2023-01-28 LAB — LACTATE DEHYDROGENASE: LDH: 142 U/L (ref 98–192)

## 2023-01-28 NOTE — Assessment & Plan Note (Addendum)
10/07/2019: T1CN0 grade 1-2 IDC ER/PR positive HER2 negative Ki-67 5% 10/20/2019: Right lumpectomy: Grade 2 IDC with focal positive margin but no additional surgery could be done because everything was removed.  T2NX stage Ib, Oncotype score 19: 6% ROR 11/16/2019-12/11/2019: Adjuvant radiation   Current treatment: Anastrozole started 12/29/2019 Anastrozole toxicities: Tolerating it extremely well without any problems or concerns.   Breast cancer surveillance: 1.  Breast exam 01/28/2023: Benign 2. mammogram 08/20/2022: Benign She enjoys playing golf at [employer].     Chronic lymphoid leukemia (HCC) B-cell CLL: Based upon flow cytometry on 08/14/2021 Lab review: 11/02/2019: WBC 16.3, ALC 9.6 08/14/2021: WBC 13.4, ALC 8.5 05/08/2022: WBC 19.8 05/21/2022: WBC 15.8 01/28/2023:   Return to clinic in 6 months for follow-up with labs

## 2023-02-06 DIAGNOSIS — C9110 Chronic lymphocytic leukemia of B-cell type not having achieved remission: Secondary | ICD-10-CM | POA: Diagnosis not present

## 2023-02-06 DIAGNOSIS — M810 Age-related osteoporosis without current pathological fracture: Secondary | ICD-10-CM | POA: Diagnosis not present

## 2023-02-06 DIAGNOSIS — E559 Vitamin D deficiency, unspecified: Secondary | ICD-10-CM | POA: Diagnosis not present

## 2023-02-06 DIAGNOSIS — C50919 Malignant neoplasm of unspecified site of unspecified female breast: Secondary | ICD-10-CM | POA: Diagnosis not present

## 2023-02-06 DIAGNOSIS — Z0000 Encounter for general adult medical examination without abnormal findings: Secondary | ICD-10-CM | POA: Diagnosis not present

## 2023-02-06 DIAGNOSIS — K589 Irritable bowel syndrome without diarrhea: Secondary | ICD-10-CM | POA: Diagnosis not present

## 2023-02-06 DIAGNOSIS — E7800 Pure hypercholesterolemia, unspecified: Secondary | ICD-10-CM | POA: Diagnosis not present

## 2023-02-06 DIAGNOSIS — R7303 Prediabetes: Secondary | ICD-10-CM | POA: Diagnosis not present

## 2023-02-06 DIAGNOSIS — I10 Essential (primary) hypertension: Secondary | ICD-10-CM | POA: Diagnosis not present

## 2023-02-06 DIAGNOSIS — N182 Chronic kidney disease, stage 2 (mild): Secondary | ICD-10-CM | POA: Diagnosis not present

## 2023-02-06 DIAGNOSIS — G4700 Insomnia, unspecified: Secondary | ICD-10-CM | POA: Diagnosis not present

## 2023-02-06 DIAGNOSIS — M19049 Primary osteoarthritis, unspecified hand: Secondary | ICD-10-CM | POA: Diagnosis not present

## 2023-04-01 DIAGNOSIS — D2261 Melanocytic nevi of right upper limb, including shoulder: Secondary | ICD-10-CM | POA: Diagnosis not present

## 2023-04-01 DIAGNOSIS — L720 Epidermal cyst: Secondary | ICD-10-CM | POA: Diagnosis not present

## 2023-04-01 DIAGNOSIS — C44712 Basal cell carcinoma of skin of right lower limb, including hip: Secondary | ICD-10-CM | POA: Diagnosis not present

## 2023-04-01 DIAGNOSIS — L821 Other seborrheic keratosis: Secondary | ICD-10-CM | POA: Diagnosis not present

## 2023-04-01 DIAGNOSIS — D1801 Hemangioma of skin and subcutaneous tissue: Secondary | ICD-10-CM | POA: Diagnosis not present

## 2023-04-01 DIAGNOSIS — D2262 Melanocytic nevi of left upper limb, including shoulder: Secondary | ICD-10-CM | POA: Diagnosis not present

## 2023-04-01 DIAGNOSIS — C44319 Basal cell carcinoma of skin of other parts of face: Secondary | ICD-10-CM | POA: Diagnosis not present

## 2023-04-01 DIAGNOSIS — Z85828 Personal history of other malignant neoplasm of skin: Secondary | ICD-10-CM | POA: Diagnosis not present

## 2023-05-27 DIAGNOSIS — Z23 Encounter for immunization: Secondary | ICD-10-CM | POA: Diagnosis not present

## 2023-06-15 DIAGNOSIS — L03221 Cellulitis of neck: Secondary | ICD-10-CM | POA: Diagnosis not present

## 2023-06-15 DIAGNOSIS — L0291 Cutaneous abscess, unspecified: Secondary | ICD-10-CM | POA: Diagnosis not present

## 2023-07-25 DIAGNOSIS — D485 Neoplasm of uncertain behavior of skin: Secondary | ICD-10-CM | POA: Diagnosis not present

## 2023-07-25 DIAGNOSIS — L929 Granulomatous disorder of the skin and subcutaneous tissue, unspecified: Secondary | ICD-10-CM | POA: Diagnosis not present

## 2023-08-14 ENCOUNTER — Other Ambulatory Visit: Payer: Self-pay | Admitting: *Deleted

## 2023-08-14 ENCOUNTER — Telehealth: Payer: Self-pay | Admitting: *Deleted

## 2023-08-14 NOTE — Telephone Encounter (Signed)
Spoke with patient. Patient requesting order for BMD to [facility].   Last BMD 03/28/21 -osteoporosis, on Boniva Last AEX  10/03/22 -ML Next AEX 11/28/23 -BS  Contact information provided to patient to call [facility] to schedule BMD. Patient verbalizes understanding and is agreeable.   Order printed and to Dr. Edward Jolly to be signed and faxed.   Routing to provider for final review. Patient is agreeable to disposition. Will close encounter.

## 2023-08-15 DIAGNOSIS — Z853 Personal history of malignant neoplasm of breast: Secondary | ICD-10-CM | POA: Diagnosis not present

## 2023-08-15 DIAGNOSIS — R7303 Prediabetes: Secondary | ICD-10-CM | POA: Diagnosis not present

## 2023-08-15 DIAGNOSIS — I10 Essential (primary) hypertension: Secondary | ICD-10-CM | POA: Diagnosis not present

## 2023-08-15 DIAGNOSIS — C9110 Chronic lymphocytic leukemia of B-cell type not having achieved remission: Secondary | ICD-10-CM | POA: Diagnosis not present

## 2023-08-15 DIAGNOSIS — N182 Chronic kidney disease, stage 2 (mild): Secondary | ICD-10-CM | POA: Diagnosis not present

## 2023-08-20 ENCOUNTER — Other Ambulatory Visit: Payer: Self-pay | Admitting: Hematology and Oncology

## 2023-08-26 ENCOUNTER — Encounter: Payer: Self-pay | Admitting: Obstetrics and Gynecology

## 2023-08-26 DIAGNOSIS — Z1231 Encounter for screening mammogram for malignant neoplasm of breast: Secondary | ICD-10-CM | POA: Diagnosis not present

## 2023-09-23 DIAGNOSIS — E2839 Other primary ovarian failure: Secondary | ICD-10-CM | POA: Diagnosis not present

## 2023-09-23 DIAGNOSIS — M8588 Other specified disorders of bone density and structure, other site: Secondary | ICD-10-CM | POA: Diagnosis not present

## 2023-09-23 DIAGNOSIS — N958 Other specified menopausal and perimenopausal disorders: Secondary | ICD-10-CM | POA: Diagnosis not present

## 2023-09-25 ENCOUNTER — Encounter: Payer: Self-pay | Admitting: Obstetrics and Gynecology

## 2023-10-06 ENCOUNTER — Other Ambulatory Visit: Payer: Self-pay | Admitting: Obstetrics & Gynecology

## 2023-10-08 NOTE — Telephone Encounter (Signed)
Pt also LVM about this in triage line, states she is due to take it tomorrow.   Last DEXA-09/23/2023-osteopenia (T-score -1.8) FRAX 33yr major 12%/Hip 3.6%

## 2023-10-08 NOTE — Telephone Encounter (Signed)
Medication refill request: boniva Last AEX:  10-03-22 Next AEX: 11-28-23 Last MMG (if hormonal medication request): 08-26-23 birads 2:neg Refill authorized: please approve if appropriate

## 2023-10-09 NOTE — Telephone Encounter (Signed)
Pt.notified

## 2023-11-05 DIAGNOSIS — K648 Other hemorrhoids: Secondary | ICD-10-CM | POA: Diagnosis not present

## 2023-11-05 DIAGNOSIS — K5730 Diverticulosis of large intestine without perforation or abscess without bleeding: Secondary | ICD-10-CM | POA: Diagnosis not present

## 2023-11-05 DIAGNOSIS — Z800 Family history of malignant neoplasm of digestive organs: Secondary | ICD-10-CM | POA: Diagnosis not present

## 2023-11-05 DIAGNOSIS — Z1211 Encounter for screening for malignant neoplasm of colon: Secondary | ICD-10-CM | POA: Diagnosis not present

## 2023-11-05 DIAGNOSIS — K635 Polyp of colon: Secondary | ICD-10-CM | POA: Diagnosis not present

## 2023-11-07 DIAGNOSIS — R070 Pain in throat: Secondary | ICD-10-CM | POA: Diagnosis not present

## 2023-11-07 DIAGNOSIS — Z20822 Contact with and (suspected) exposure to covid-19: Secondary | ICD-10-CM | POA: Diagnosis not present

## 2023-11-07 DIAGNOSIS — K635 Polyp of colon: Secondary | ICD-10-CM | POA: Diagnosis not present

## 2023-11-14 DIAGNOSIS — R059 Cough, unspecified: Secondary | ICD-10-CM | POA: Diagnosis not present

## 2023-11-14 DIAGNOSIS — J40 Bronchitis, not specified as acute or chronic: Secondary | ICD-10-CM | POA: Diagnosis not present

## 2023-11-14 NOTE — Progress Notes (Signed)
 83 y.o. G16P2002 Married Caucasian female here for a breast and pelvic exam.    The patient is also followed for osteoporosis care and Boniva treatment. Doing well with treatment.  Had Covid 4 weeks ago for the first time.   Moving to [facility] in the future.  Son is an Designer, industrial/product at [employer].    PCP: Georgann Housekeeper, MD   No LMP recorded. Patient is postmenopausal.           Sexually active: No.  The current method of family planning is post menopausal status.    Menopausal hormone therapy:  n/a Exercising: Yes.     Weights and elliptical Smoker:  no  OB History     Gravida  2   Para  2   Term  2   Preterm      AB      Living  2      SAB      IAB      Ectopic      Multiple      Live Births              HEALTH MAINTENANCE: Last 2 paps: 07/13/19 neg History of abnormal Pap or positive HPV:  no Mammogram:  08/26/23 Breast Density Cat B, BI-RADS CAT 2 neg Colonoscopy:  2019 Bone Density:  09/23/23  Result  osteopenia of hips and spine.  On Boniva.   Immunization History  Administered Date(s) Administered   Influenza-Unspecified 05/11/2014   PFIZER(Purple Top)SARS-COV-2 Vaccination 09/24/2019, 10/14/2019      reports that she has never smoked. She has never used smokeless tobacco. She reports that she does not currently use alcohol. She reports that she does not use drugs.  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     anastrozole (ARIMIDEX) 1 MG tablet take one tablet by mouth once daily 90 tablet 4   atorvastatin (LIPITOR) 10 MG tablet Take 10 mg by mouth daily.     Calcium Carbonate-Vitamin D (CALCIUM + D PO) Take by mouth daily.     ibandronate (BONIVA) 150 MG tablet take 1 TABlet EVERY 28 DAYS, TAKE IN A.M. With FULL GLASS OF WATER ON AN EMPTY STOMACH, DO NoT LIE DOWN 3 tablet 0   ibuprofen (ADVIL,MOTRIN) 200 MG tablet Take 400-600 mg by mouth every 6 (six) hours as needed for moderate pain.     metoprolol succinate (TOPROL-XL) 50 MG 24 hr tablet TAKE 1 TABLET ONCE DAILY. 30 tablet 1   No current facility-administered medications for this visit.    ALLERGIES: Lisinopril, Prednisone, Sulfa antibiotics, Terazol [terconazole], Zolpidem, Ambien [zolpidem tartrate], and Codeine  Family History  Problem Relation Age  of Onset   Hypertension Mother    Heart disease Father    Colon polyps Father    Colon cancer Father 90   Colon polyps Brother    Bone cancer Nephew 40   Heart disease Maternal Grandmother    Heart disease Maternal Grandfather     Review of Systems  All other systems reviewed and are negative.   PHYSICAL EXAM:  BP 126/84 (BP Location: Left Arm, Patient Position: Sitting, Cuff Size: Small)   Pulse 70   Ht 5\' 5"  (1.651 m)   Wt 116 lb (52.6 kg)   SpO2 98%   BMI 19.30 kg/m     General appearance: alert, cooperative and appears stated age Head: normocephalic, without obvious abnormality, atraumatic Neck: no adenopathy, supple, symmetrical, trachea midline and thyroid normal to inspection and palpation Lungs: clear to auscultation bilaterally Breasts: normal appearance, no masses or tenderness, No nipple retraction or dimpling, No nipple discharge or bleeding, No axillary adenopathy Heart: regular  rate and rhythm Abdomen: soft, non-tender; no masses, no organomegaly Extremities: extremities normal, atraumatic, no cyanosis or edema Skin: skin color, texture, turgor normal. No rashes or lesions Lymph nodes: cervical, supraclavicular, and axillary nodes normal. Neurologic: grossly normal  Pelvic: External genitalia:  no lesions              No abnormal inguinal nodes palpated.              Urethra:  normal appearing urethra with no masses, tenderness or lesions              Bartholins and Skenes: normal                 Vagina: normal appearing vagina with normal color and discharge, no lesions              Cervix: no lesions              Pap taken: yes Bimanual Exam:  Uterus:  normal size, contour, position, consistency, mobility, non-tender              Adnexa: no mass, fullness, tenderness              Rectal exam: yes.  Confirms.              Anus:  normal sphincter tone, no lesions  Chaperone was present for exam:  Warren Lacy, CMA  ASSESSMENT: Encounter for breast and pelvic exam.  Personal hx of other medical tx.  History of right breast cancer.  Status post partial mastectomy.  On Arimidex. Negative genetic testing.  Osteoporosis.  On Boniva since around 2021.  Current BMD showing osteopenia.  Cervical cancer sreening.   PLAN: Mammogram screening discussed. Self breast awareness reviewed. Pap and reflex HRV collected:  yes Guidelines for Calcium, Vitamin D, regular exercise program including cardiovascular and weight bearing exercise. Medication refills:  Boniva refills for one year.   Last BMD, Boniva, and Arimidex effect on bone density discussed.  Next BMD due in 2027.  Follow up:  prn   Additional counseling given.  yes. 20 min  total time was spent for this patient encounter, including preparation, face-to-face counseling with the patient, coordination of care, and documentation of the encounter in addition to doing the breast and pelvic exam and pap.

## 2023-11-28 ENCOUNTER — Ambulatory Visit (INDEPENDENT_AMBULATORY_CARE_PROVIDER_SITE_OTHER): Payer: Medicare Other | Admitting: Obstetrics and Gynecology

## 2023-11-28 ENCOUNTER — Other Ambulatory Visit (HOSPITAL_COMMUNITY)
Admission: RE | Admit: 2023-11-28 | Discharge: 2023-11-28 | Disposition: A | Discharge status: Home / Self Care | Source: Ambulatory Visit | Attending: Obstetrics and Gynecology | Admitting: Obstetrics and Gynecology

## 2023-11-28 ENCOUNTER — Encounter: Payer: Self-pay | Admitting: Obstetrics and Gynecology

## 2023-11-28 VITALS — BP 126/84 | HR 70 | Ht 65.0 in | Wt 116.0 lb

## 2023-11-28 DIAGNOSIS — Z853 Personal history of malignant neoplasm of breast: Secondary | ICD-10-CM

## 2023-11-28 DIAGNOSIS — Z9189 Other specified personal risk factors, not elsewhere classified: Secondary | ICD-10-CM

## 2023-11-28 DIAGNOSIS — Z124 Encounter for screening for malignant neoplasm of cervix: Secondary | ICD-10-CM

## 2023-11-28 DIAGNOSIS — Z79818 Long term (current) use of other agents affecting estrogen receptors and estrogen levels: Secondary | ICD-10-CM

## 2023-11-28 DIAGNOSIS — M810 Age-related osteoporosis without current pathological fracture: Secondary | ICD-10-CM | POA: Diagnosis not present

## 2023-11-28 DIAGNOSIS — Z01419 Encounter for gynecological examination (general) (routine) without abnormal findings: Secondary | ICD-10-CM

## 2023-11-28 DIAGNOSIS — Z5181 Encounter for therapeutic drug level monitoring: Secondary | ICD-10-CM | POA: Diagnosis not present

## 2023-11-28 DIAGNOSIS — Z9289 Personal history of other medical treatment: Secondary | ICD-10-CM

## 2023-11-28 MED ORDER — IBANDRONATE SODIUM 150 MG PO TABS: ORAL_TABLET | ORAL | 3 refills | Status: AC

## 2023-11-28 NOTE — Patient Instructions (Signed)

## 2023-11-29 LAB — CYTOLOGY - PAP: Diagnosis: NEGATIVE

## 2023-12-02 ENCOUNTER — Encounter: Payer: Self-pay | Admitting: Obstetrics and Gynecology

## 2024-01-01 DIAGNOSIS — H26493 Other secondary cataract, bilateral: Secondary | ICD-10-CM | POA: Diagnosis not present

## 2024-01-01 DIAGNOSIS — Z961 Presence of intraocular lens: Secondary | ICD-10-CM | POA: Diagnosis not present

## 2024-01-01 DIAGNOSIS — H53143 Visual discomfort, bilateral: Secondary | ICD-10-CM | POA: Diagnosis not present

## 2024-01-01 DIAGNOSIS — H3530 Unspecified macular degeneration: Secondary | ICD-10-CM | POA: Diagnosis not present

## 2024-01-07 ENCOUNTER — Inpatient Hospital Stay: Attending: Hematology and Oncology

## 2024-01-07 ENCOUNTER — Inpatient Hospital Stay (HOSPITAL_BASED_OUTPATIENT_CLINIC_OR_DEPARTMENT_OTHER): Admitting: Hematology and Oncology

## 2024-01-07 VITALS — BP 118/80 | HR 68 | Temp 98.0°F | Resp 18 | Ht 65.0 in | Wt 117.9 lb

## 2024-01-07 DIAGNOSIS — Z170 Estrogen receptor positive status [ER+]: Secondary | ICD-10-CM | POA: Insufficient documentation

## 2024-01-07 DIAGNOSIS — Z882 Allergy status to sulfonamides status: Secondary | ICD-10-CM | POA: Insufficient documentation

## 2024-01-07 DIAGNOSIS — Z1721 Progesterone receptor positive status: Secondary | ICD-10-CM | POA: Diagnosis not present

## 2024-01-07 DIAGNOSIS — Z1732 Human epidermal growth factor receptor 2 negative status: Secondary | ICD-10-CM | POA: Insufficient documentation

## 2024-01-07 DIAGNOSIS — C9110 Chronic lymphocytic leukemia of B-cell type not having achieved remission: Secondary | ICD-10-CM | POA: Insufficient documentation

## 2024-01-07 DIAGNOSIS — C50411 Malignant neoplasm of upper-outer quadrant of right female breast: Secondary | ICD-10-CM

## 2024-01-07 DIAGNOSIS — Z79899 Other long term (current) drug therapy: Secondary | ICD-10-CM | POA: Insufficient documentation

## 2024-01-07 DIAGNOSIS — Z888 Allergy status to other drugs, medicaments and biological substances status: Secondary | ICD-10-CM | POA: Diagnosis not present

## 2024-01-07 DIAGNOSIS — M8580 Other specified disorders of bone density and structure, unspecified site: Secondary | ICD-10-CM | POA: Insufficient documentation

## 2024-01-07 DIAGNOSIS — Z885 Allergy status to narcotic agent status: Secondary | ICD-10-CM | POA: Diagnosis not present

## 2024-01-07 DIAGNOSIS — Z79811 Long term (current) use of aromatase inhibitors: Secondary | ICD-10-CM | POA: Insufficient documentation

## 2024-01-07 LAB — CMP (CANCER CENTER ONLY)
ALT: 15 U/L (ref 0–44)
AST: 20 U/L (ref 15–41)
Albumin: 4.4 g/dL (ref 3.5–5.0)
Alkaline Phosphatase: 58 U/L (ref 38–126)
Anion gap: 6 (ref 5–15)
BUN: 19 mg/dL (ref 8–23)
CO2: 31 mmol/L (ref 22–32)
Calcium: 9.3 mg/dL (ref 8.9–10.3)
Chloride: 106 mmol/L (ref 98–111)
Creatinine: 0.99 mg/dL (ref 0.44–1.00)
GFR, Estimated: 57 mL/min — ABNORMAL LOW (ref >60)
Glucose, Bld: 70 mg/dL (ref 70–99)
Potassium: 4.1 mmol/L (ref 3.5–5.1)
Sodium: 143 mmol/L (ref 135–145)
Total Bilirubin: 0.4 mg/dL (ref 0.0–1.2)
Total Protein: 6.8 g/dL (ref 6.5–8.1)

## 2024-01-07 LAB — CBC WITH DIFFERENTIAL (CANCER CENTER ONLY)
Abs Immature Granulocytes: 0.04 10*3/uL (ref 0.00–0.07)
Basophils Absolute: 0.1 10*3/uL (ref 0.0–0.1)
Basophils Relative: 0 %
Eosinophils Absolute: 0.2 10*3/uL (ref 0.0–0.5)
Eosinophils Relative: 1 %
HCT: 37.7 % (ref 36.0–46.0)
Hemoglobin: 12.6 g/dL (ref 12.0–15.0)
Immature Granulocytes: 0 %
Lymphocytes Relative: 74 %
Lymphs Abs: 13.5 10*3/uL — ABNORMAL HIGH (ref 0.7–4.0)
MCH: 31.3 pg (ref 26.0–34.0)
MCHC: 33.4 g/dL (ref 30.0–36.0)
MCV: 93.8 fL (ref 80.0–100.0)
Monocytes Absolute: 0.8 10*3/uL (ref 0.1–1.0)
Monocytes Relative: 4 %
Neutro Abs: 3.8 10*3/uL (ref 1.7–7.7)
Neutrophils Relative %: 21 %
Platelet Count: 171 10*3/uL (ref 150–400)
RBC: 4.02 MIL/uL (ref 3.87–5.11)
RDW: 14.1 % (ref 11.5–15.5)
Smear Review: NORMAL
WBC Count: 18.4 10*3/uL — ABNORMAL HIGH (ref 4.0–10.5)
nRBC: 0 % (ref 0.0–0.2)

## 2024-01-07 LAB — LACTATE DEHYDROGENASE: LDH: 147 U/L (ref 98–192)

## 2024-01-07 MED ORDER — ANASTROZOLE 1 MG PO TABS: 1.0000 mg | ORAL_TABLET | Freq: Every day | ORAL | 4 refills | Status: AC

## 2024-01-07 NOTE — Assessment & Plan Note (Signed)
 10/07/2019: T1CN0 grade 1-2 IDC ER/PR positive HER2 negative Ki-67 5% 10/20/2019: Right lumpectomy: Grade 2 IDC with focal positive margin but no additional surgery could be done because everything was removed.  T2NX stage Ib, Oncotype score 19: 6% ROR 11/16/2019-12/11/2019: Adjuvant radiation   Current treatment: Anastrozole  started 12/29/2019 Anastrozole  toxicities: Tolerating it extremely well without any problems or concerns.   Breast cancer surveillance: 1.  Breast exam 01/07/2024: Benign 2. mammogram 08/26/2023: Benign, breast density category B She enjoys playing golf at [employer]. Bone density 09/23/2023: T-score -1.8: Osteopenia recommend calcium vitamin D  and weightbearing exercises     Chronic lymphoid leukemia (HCC) B-cell CLL: Based upon flow cytometry on 08/14/2021 Lab review: 11/02/2019: WBC 16.3, ALC 9.6 08/14/2021: WBC 13.4, ALC 8.5 05/08/2022: WBC 19.8 05/21/2022: WBC 15.8 01/28/2023: WBC 16.3, ALC 11.8 01/07/2024:   Return to clinic in 1 year for follow-up with labs

## 2024-01-07 NOTE — Progress Notes (Signed)
 Patient Care Team: Jearldine Mina, MD as PCP - General (Internal Medicine) Oralee Billow, MD as Consulting Physician (General Surgery) Auther Bo, RN as Oncology Nurse Navigator Alane Hsu, RN as Oncology Nurse Navigator Colie Dawes, MD as Consulting Physician (Radiation Oncology) Percy Bracken, MD as Consulting Physician (Obstetrics and Gynecology) Dorisann Garre, Thornell Flirt, MD as Referring Physician (Dermatology) Lanita Pitman, MD as Consulting Physician (Gastroenterology) Princella Brooklyn, OD (Optometry) Cameron Cea, MD as Medical Oncologist (Hematology and Oncology)  DIAGNOSIS:  Encounter Diagnosis  Name Primary?   Malignant neoplasm of upper-outer quadrant of right breast in female, estrogen receptor positive (HCC) Yes    SUMMARY OF ONCOLOGIC HISTORY: Oncology History  Malignant neoplasm of upper-outer quadrant of right breast in female, estrogen receptor positive (HCC)  10/07/2019 Cancer Staging   Staging form: Breast, AJCC 8th Edition - Clinical stage from 10/07/2019: Stage IA (cT1c, cN0, cM0, G1, ER+, PR+, HER2-) - Signed by Percival Brace, NP on 10/14/2019   10/14/2019 Initial Diagnosis   Malignant neoplasm of upper-outer quadrant of right breast in female, estrogen receptor positive (HCC)   10/23/2019 Cancer Staging   Staging form: Breast, AJCC 8th Edition - Pathologic stage from 10/23/2019: Stage Unknown (pT2, pNX, G2, ER+, PR+, HER2-) - Signed by Colie Dawes, MD on 10/23/2019   04/04/2020 Genetic Testing   Negative genetic testing on the common hereditary cancer panel.  The Common Hereditary Gene Panel offered by [facility] includes sequencing and/or deletion duplication testing of the following 48 genes: APC, ATM, AXIN2, BARD1, BMPR1A, BRCA1, BRCA2, BRIP1, CDH1, CDK4, CDKN2A (p14ARF), CDKN2A (p16INK4a), CHEK2, CTNNA1, DICER1, EPCAM (Deletion/duplication testing only), GREM1 (promoter region deletion/duplication testing only), KIT, MEN1, MLH1, MSH2,  MSH3, MSH6, MUTYH, NBN, NF1, NHTL1, PALB2, PDGFRA, PMS2, POLD1, POLE, PTEN, RAD50, RAD51C, RAD51D, RNF43, SDHB, SDHC, SDHD, SMAD4, SMARCA4. STK11, TP53, TSC1, TSC2, and VHL.  The following genes were evaluated for sequence changes only: SDHA and HOXB13 c.251G>A variant only. The report date is April 04, 2020     CHIEF COMPLIANT: Surveillance of breast cancer and history of CLL  HISTORY OF PRESENT ILLNESS:   History of Present Illness Vanessa Shaffer is an 83 year old female with history of breast cancer and chronic lymphocytic leukemia (CLL) who presents for follow-up and medication management.  Her chronic lymphocytic leukemia remains stable without treatment. There are no significant changes in symptoms related to CLL. Red blood cells and platelets are unaffected. White blood cell count has increased slightly from 16 to 18 over the past year, with a lymphocyte count around 12.2 or 12.3.  No B symptoms  She is on anastrozole  for breast cancer, having completed four years of treatment, with plans to continue for another year.       ALLERGIES:  is allergic to lisinopril, prednisone, sulfa antibiotics, terazol [terconazole], zolpidem, ambien [zolpidem tartrate], and codeine.  MEDICATIONS:  Current Outpatient Medications  Medication Sig Dispense Refill   atorvastatin  (LIPITOR) 10 MG tablet Take 10 mg by mouth daily.     Calcium Carbonate-Vitamin D  (CALCIUM + D PO) Take by mouth daily.     ibandronate  (BONIVA ) 150 MG tablet take 1 TABlet EVERY 28 DAYS, TAKE IN A.M. With FULL GLASS OF WATER ON AN EMPTY STOMACH, DO NoT LIE DOWN 3 tablet 3   metoprolol  succinate (TOPROL -XL) 50 MG 24 hr tablet TAKE 1 TABLET ONCE DAILY. 30 tablet 1   acetaminophen  (TYLENOL ) 500 MG tablet Take 1,000 mg by mouth every 6 (six) hours as needed for moderate pain. (Patient  not taking: Reported on 01/07/2024)     anastrozole  (ARIMIDEX ) 1 MG tablet Take 1 tablet (1 mg total) by mouth daily. 90 tablet 4   ibuprofen  (ADVIL,MOTRIN) 200 MG tablet Take 400-600 mg by mouth every 6 (six) hours as needed for moderate pain. (Patient not taking: Reported on 01/07/2024)     No current facility-administered medications for this visit.    PHYSICAL EXAMINATION: ECOG PERFORMANCE STATUS: 1 - Symptomatic but completely ambulatory  Vitals:   01/07/24 1047  BP: 118/80  Pulse: 68  Resp: 18  Temp: 98 F (36.7 C)  SpO2: 100%   Filed Weights   01/07/24 1047  Weight: 117 lb 14.4 oz (53.5 kg)      LABORATORY DATA:  I have reviewed the data as listed    Latest Ref Rng & Units 01/07/2024   10:15 AM 01/28/2023    9:23 AM 05/21/2022    9:26 AM  CMP  Glucose 70 - 99 mg/dL 70  161  096   BUN 8 - 23 mg/dL 19  18  18    Creatinine 0.44 - 1.00 mg/dL 0.45  4.09  8.11   Sodium 135 - 145 mmol/L 143  141  142   Potassium 3.5 - 5.1 mmol/L 4.1  4.0  4.0   Chloride 98 - 111 mmol/L 106  105  106   CO2 22 - 32 mmol/L 31  30  30    Calcium 8.9 - 10.3 mg/dL 9.3  9.2  9.3   Total Protein 6.5 - 8.1 g/dL 6.8  6.8  6.9   Total Bilirubin 0.0 - 1.2 mg/dL 0.4  0.4  0.3   Alkaline Phos 38 - 126 U/L 58  65  63   AST 15 - 41 U/L 20  21  18    ALT 0 - 44 U/L 15  17  17      Lab Results  Component Value Date   WBC 18.4 (H) 01/07/2024   HGB 12.6 01/07/2024   HCT 37.7 01/07/2024   MCV 93.8 01/07/2024   PLT 171 01/07/2024   NEUTROABS 3.8 01/07/2024    ASSESSMENT & PLAN:  Malignant neoplasm of upper-outer quadrant of right breast in female, estrogen receptor positive (HCC) 10/07/2019: T1CN0 grade 1-2 IDC ER/PR positive HER2 negative Ki-67 5% 10/20/2019: Right lumpectomy: Grade 2 IDC with focal positive margin but no additional surgery could be done because everything was removed.  T2NX stage Ib, Oncotype score 19: 6% ROR 11/16/2019-12/11/2019: Adjuvant radiation   Current treatment: Anastrozole  started 12/29/2019 Anastrozole  toxicities: Tolerating it extremely well without any problems or concerns.   Breast cancer surveillance: 1.   Breast exam 01/07/2024: Benign 2. mammogram 08/26/2023: Benign, breast density category B She enjoys playing golf at [employer]. Bone density 09/23/2023: T-score -1.8: Osteopenia recommend calcium vitamin D  and weightbearing exercises     Chronic lymphoid leukemia (HCC) B-cell CLL: Based upon flow cytometry on 08/14/2021 Lab review: 11/02/2019: WBC 16.3, ALC 9.6 08/14/2021: WBC 13.4, ALC 8.5 05/08/2022: WBC 19.8 05/21/2022: WBC 15.8 01/28/2023: WBC 16.3, ALC 11.8 01/07/2024: WBC 18.4, ALC 13.5 Overall stable findings and there is no indication to treat   Patient is moving to [facility] and will find hematologist oncologist locally and we will provide all required information for her continuity of care.     No orders of the defined types were placed in this encounter.  The patient has a good understanding of the overall plan. she agrees with it. she will call with any problems that may develop  before the next visit here. Total time spent: 30 mins including face to face time and time spent for planning, charting and co-ordination of care   Margert Sheerer, MD 01/07/24

## 2024-01-15 DIAGNOSIS — Z23 Encounter for immunization: Secondary | ICD-10-CM | POA: Diagnosis not present

## 2024-01-15 DIAGNOSIS — M810 Age-related osteoporosis without current pathological fracture: Secondary | ICD-10-CM | POA: Diagnosis not present

## 2024-01-15 DIAGNOSIS — Z0000 Encounter for general adult medical examination without abnormal findings: Secondary | ICD-10-CM | POA: Diagnosis not present

## 2024-01-15 DIAGNOSIS — E7800 Pure hypercholesterolemia, unspecified: Secondary | ICD-10-CM | POA: Diagnosis not present

## 2024-01-15 DIAGNOSIS — R7303 Prediabetes: Secondary | ICD-10-CM | POA: Diagnosis not present

## 2024-01-15 DIAGNOSIS — C9110 Chronic lymphocytic leukemia of B-cell type not having achieved remission: Secondary | ICD-10-CM | POA: Diagnosis not present

## 2024-01-15 DIAGNOSIS — C50919 Malignant neoplasm of unspecified site of unspecified female breast: Secondary | ICD-10-CM | POA: Diagnosis not present

## 2024-01-15 DIAGNOSIS — I10 Essential (primary) hypertension: Secondary | ICD-10-CM | POA: Diagnosis not present

## 2024-01-15 DIAGNOSIS — N182 Chronic kidney disease, stage 2 (mild): Secondary | ICD-10-CM | POA: Diagnosis not present

## 2024-01-15 DIAGNOSIS — F411 Generalized anxiety disorder: Secondary | ICD-10-CM | POA: Diagnosis not present

## 2024-01-15 DIAGNOSIS — E559 Vitamin D deficiency, unspecified: Secondary | ICD-10-CM | POA: Diagnosis not present

## 2024-01-15 DIAGNOSIS — G4700 Insomnia, unspecified: Secondary | ICD-10-CM | POA: Diagnosis not present

## 2024-01-29 ENCOUNTER — Ambulatory Visit: Payer: Medicare Other | Admitting: Hematology and Oncology

## 2024-01-29 ENCOUNTER — Other Ambulatory Visit: Payer: Medicare Other
